# Patient Record
Sex: Female | Born: 1935 | Race: Asian | Hispanic: No | Smoking: Never smoker
Health system: Southern US, Community
[De-identification: ages and names within clinical notes are randomized; demographics above are authoritative.]

## PROBLEM LIST (undated history)

## (undated) DIAGNOSIS — M810 Age-related osteoporosis without current pathological fracture: Secondary | ICD-10-CM

## (undated) DIAGNOSIS — I10 Essential (primary) hypertension: Secondary | ICD-10-CM

## (undated) DIAGNOSIS — H9190 Unspecified hearing loss, unspecified ear: Secondary | ICD-10-CM

## (undated) DIAGNOSIS — H3530 Unspecified macular degeneration: Secondary | ICD-10-CM

## (undated) DIAGNOSIS — E039 Hypothyroidism, unspecified: Secondary | ICD-10-CM

## (undated) DIAGNOSIS — C50919 Malignant neoplasm of unspecified site of unspecified female breast: Secondary | ICD-10-CM

## (undated) DIAGNOSIS — D649 Anemia, unspecified: Secondary | ICD-10-CM

## (undated) DIAGNOSIS — R32 Unspecified urinary incontinence: Secondary | ICD-10-CM

## (undated) DIAGNOSIS — E785 Hyperlipidemia, unspecified: Secondary | ICD-10-CM

## (undated) DIAGNOSIS — I517 Cardiomegaly: Secondary | ICD-10-CM

## (undated) HISTORY — DX: Hypothyroidism, unspecified: E03.9

## (undated) HISTORY — DX: Unspecified macular degeneration: H35.30

## (undated) HISTORY — DX: Malignant neoplasm of unspecified site of unspecified female breast: C50.919

## (undated) HISTORY — DX: Hyperlipidemia, unspecified: E78.5

## (undated) HISTORY — DX: Age-related osteoporosis without current pathological fracture: M81.0

## (undated) HISTORY — DX: Cardiomegaly: I51.7

## (undated) HISTORY — DX: Anemia, unspecified: D64.9

## (undated) HISTORY — DX: Unspecified urinary incontinence: R32

## (undated) HISTORY — PX: THYROID SURGERY: SHX805

## (undated) HISTORY — DX: Essential (primary) hypertension: I10

## (undated) HISTORY — DX: Unspecified hearing loss, unspecified ear: H91.90

---

## 2009-10-25 ENCOUNTER — Encounter: Admission: RE | Admit: 2009-10-25 | Discharge: 2009-10-25 | Payer: Self-pay | Admitting: Family Medicine

## 2010-11-09 ENCOUNTER — Encounter: Payer: Self-pay | Admitting: Family Medicine

## 2011-03-17 ENCOUNTER — Other Ambulatory Visit: Payer: Self-pay | Admitting: Family Medicine

## 2011-03-17 DIAGNOSIS — R42 Dizziness and giddiness: Secondary | ICD-10-CM

## 2014-02-15 ENCOUNTER — Ambulatory Visit
Admission: RE | Admit: 2014-02-15 | Discharge: 2014-02-15 | Disposition: A | Discharge status: Home / Self Care | Payer: Medicare Other | Source: Ambulatory Visit | Attending: Family Medicine | Admitting: Family Medicine

## 2014-02-15 ENCOUNTER — Other Ambulatory Visit: Payer: Self-pay | Admitting: Family Medicine

## 2014-02-15 DIAGNOSIS — R05 Cough: Secondary | ICD-10-CM

## 2014-02-15 DIAGNOSIS — R059 Cough, unspecified: Secondary | ICD-10-CM

## 2014-03-06 ENCOUNTER — Other Ambulatory Visit (HOSPITAL_COMMUNITY): Payer: Self-pay | Admitting: Radiology

## 2014-03-06 ENCOUNTER — Other Ambulatory Visit (HOSPITAL_COMMUNITY): Payer: Medicare Other

## 2014-03-06 DIAGNOSIS — R0609 Other forms of dyspnea: Secondary | ICD-10-CM

## 2014-03-06 DIAGNOSIS — R0989 Other specified symptoms and signs involving the circulatory and respiratory systems: Principal | ICD-10-CM

## 2014-03-22 ENCOUNTER — Ambulatory Visit (HOSPITAL_COMMUNITY): Payer: Medicare Other | Attending: Cardiovascular Disease | Admitting: Radiology

## 2014-03-22 ENCOUNTER — Other Ambulatory Visit (HOSPITAL_COMMUNITY): Payer: Self-pay | Admitting: Family Medicine

## 2014-03-22 DIAGNOSIS — I079 Rheumatic tricuspid valve disease, unspecified: Secondary | ICD-10-CM | POA: Insufficient documentation

## 2014-03-22 DIAGNOSIS — I379 Nonrheumatic pulmonary valve disorder, unspecified: Secondary | ICD-10-CM | POA: Insufficient documentation

## 2014-03-22 DIAGNOSIS — R0989 Other specified symptoms and signs involving the circulatory and respiratory systems: Secondary | ICD-10-CM

## 2014-03-22 DIAGNOSIS — I2789 Other specified pulmonary heart diseases: Secondary | ICD-10-CM

## 2014-03-22 DIAGNOSIS — I272 Pulmonary hypertension, unspecified: Secondary | ICD-10-CM

## 2014-03-22 DIAGNOSIS — R0609 Other forms of dyspnea: Secondary | ICD-10-CM

## 2014-03-22 DIAGNOSIS — R0602 Shortness of breath: Secondary | ICD-10-CM

## 2014-03-22 NOTE — Progress Notes (Signed)
Echocardiogram performed.  

## 2014-03-30 ENCOUNTER — Institutional Professional Consult (permissible substitution): Payer: Medicare Other | Admitting: Internal Medicine

## 2014-08-21 ENCOUNTER — Ambulatory Visit (INDEPENDENT_AMBULATORY_CARE_PROVIDER_SITE_OTHER)
Admission: RE | Admit: 2014-08-21 | Discharge: 2014-08-21 | Disposition: A | Discharge status: Home / Self Care | Payer: Medicare Other | Source: Ambulatory Visit | Attending: Emergency Medicine | Admitting: Emergency Medicine

## 2014-08-21 ENCOUNTER — Ambulatory Visit (INDEPENDENT_AMBULATORY_CARE_PROVIDER_SITE_OTHER): Payer: Medicare Other | Admitting: Emergency Medicine

## 2014-08-21 ENCOUNTER — Encounter: Payer: Self-pay | Admitting: Emergency Medicine

## 2014-08-21 ENCOUNTER — Encounter (INDEPENDENT_AMBULATORY_CARE_PROVIDER_SITE_OTHER): Payer: Self-pay

## 2014-08-21 VITALS — BP 110/70 | HR 61 | Temp 97.0°F | Ht 60.0 in | Wt 130.0 lb

## 2014-08-21 DIAGNOSIS — R0600 Dyspnea, unspecified: Secondary | ICD-10-CM | POA: Insufficient documentation

## 2014-08-21 DIAGNOSIS — I272 Other secondary pulmonary hypertension: Secondary | ICD-10-CM

## 2014-08-21 DIAGNOSIS — IMO0002 Reserved for concepts with insufficient information to code with codable children: Secondary | ICD-10-CM | POA: Insufficient documentation

## 2014-08-21 DIAGNOSIS — I10 Essential (primary) hypertension: Secondary | ICD-10-CM | POA: Insufficient documentation

## 2014-08-21 DIAGNOSIS — I5032 Chronic diastolic (congestive) heart failure: Secondary | ICD-10-CM

## 2014-08-21 NOTE — Patient Instructions (Signed)
Chest x-ray today We will obtain your notes from Dr. Stephanie Acre Follow-up with Dr. Lamonte Sakai next available appointment

## 2014-08-21 NOTE — Progress Notes (Signed)
Subjective:    Patient ID: Carrie Cain, female    DOB: 1923/07/10, 80 y.o.   MRN: [id number]  HPI 80 yo woman, never smoker, chart notes that indicate hx of breast cancer that not been treated (4 years), HTN, cardiomegaly, hypothyroidism. She is referred by Dr Stephanie Acre today for dyspnea, recurrent episodes of dyspnea that seem to correlate with her volume status. She cannot walk long distances without stopping to rest > 127ft. Denies CP. She was treated by Dr Stephanie Acre for a PNA in late September. Has subsequently been treated for volume overload.   TTE 03/2014 > LVH with diastolic dysfxn, PASP 63SLHT.    Review of Systems  Constitutional: Negative for fever and unexpected weight change.  HENT: Negative for congestion, dental problem, ear pain, nosebleeds, postnasal drip, rhinorrhea, sinus pressure, sneezing, sore throat and trouble swallowing.   Eyes: Negative for redness and itching.  Respiratory: Positive for cough, chest tightness and shortness of breath.   Cardiovascular: Negative for palpitations and leg swelling.  Gastrointestinal: Negative for nausea and vomiting.  Genitourinary: Negative for dysuria.  Musculoskeletal: Negative for joint swelling.  Skin: Negative for rash.  Neurological: Negative for headaches.  Hematological: Does not bruise/bleed easily.  Psychiatric/Behavioral: Negative for dysphoric mood. The patient is not nervous/anxious.    Past Medical History  Diagnosis Date  . Cancer of breast   . Macular degeneration   . Cardiomegaly   . Hyperlipidemia   . Hypertension      Family History  Problem Relation Age of Onset  . Heart failure Son     deceased  . Cancer - Lung Son     deceased  . Breast cancer Daughter     deceased  . Hypertension Son   . Hyperlipidemia Son      History   Social History  . Marital Status: Unknown    Spouse Name: N/A    Number of Children: N/A  . Years of Education: N/A   Occupational History  . Not on file.    Social History Main Topics  . Smoking status: Never Smoker   . Smokeless tobacco: Not on file  . Alcohol Use: No  . Drug Use: No  . Sexual Activity: Not Currently   Other Topics Concern  . Not on file   Social History Narrative  . No narrative on file     No Known Allergies   No outpatient prescriptions prior to visit.   No facility-administered medications prior to visit.         Objective:   Physical Exam Filed Vitals:   08/21/14 1131  BP: 110/70  Pulse: 61  Temp: 97 F (36.1 C)  TempSrc: Oral  Height: 5' (1.524 m)  Weight: 130 lb (58.968 kg)  SpO2: 96%   Gen: quiet elderly woman, in no distress,  Kyphotic, her assistant states that she understands [place] but I believe that this is minimal  ENT: No lesions,  mouth clear,  oropharynx clear, no postnasal drip  Neck: No JVD, no TMG, no carotid bruits  Lungs: No use of accessory muscles, right basilar inspiratory crackles one third up, left is clear  Cardiovascular: RRR, heart sounds normal, no murmur or gallops, no peripheral edema  Musculoskeletal: No deformities, no cyanosis or clubbing  Neuro: alert, non focal  Skin: Warm, no lesions or rash  Breast exam was not performed but it was indicated to me that she has a palpable left breast mass that is tender      Assessment &  Plan:  Dyspnea Etiology unclear but based on history she has responded to diuretics. She has hypertension with diastolic dysfunction, LVH, and secondary pulmonary hypertension. Of course it is possible that other processes are contributing to her shortness of breath. She does have reported breast cancer that has been untreated and which could be causing metastatic disease. I do not hear any evidence of pleural effusion on exam but certainly this is possible as well.  - I think we should start her evaluation with a simple chest x-ray to see if she is volume overloaded she has any evidence of metastatic disease.  - Scheduled daily  low-dose Lasix may be appropriate - Follow up next available to review the chest x-ray, her notes from Dr. Cheron Schaumann and to make plans for treatment

## 2014-08-21 NOTE — Assessment & Plan Note (Signed)
Etiology unclear but based on history she has responded to diuretics. She has hypertension with diastolic dysfunction, LVH, and secondary pulmonary hypertension. Of course it is possible that other processes are contributing to her shortness of breath. She does have reported breast cancer that has been untreated and which could be causing metastatic disease. I do not hear any evidence of pleural effusion on exam but certainly this is possible as well.  - I think we should start her evaluation with a simple chest x-ray to see if she is volume overloaded she has any evidence of metastatic disease.  - Scheduled daily low-dose Lasix may be appropriate - Follow up next available to review the chest x-ray, her notes from Dr. Cheron Schaumann and to make plans for treatment

## 2014-09-03 ENCOUNTER — Other Ambulatory Visit (INDEPENDENT_AMBULATORY_CARE_PROVIDER_SITE_OTHER): Payer: Self-pay | Admitting: General Surgery

## 2014-09-03 DIAGNOSIS — C50919 Malignant neoplasm of unspecified site of unspecified female breast: Secondary | ICD-10-CM

## 2014-09-06 ENCOUNTER — Other Ambulatory Visit: Payer: Self-pay | Admitting: Radiology

## 2014-09-06 ENCOUNTER — Encounter (INDEPENDENT_AMBULATORY_CARE_PROVIDER_SITE_OTHER): Payer: Self-pay

## 2014-09-06 ENCOUNTER — Other Ambulatory Visit (INDEPENDENT_AMBULATORY_CARE_PROVIDER_SITE_OTHER): Payer: Self-pay | Admitting: General Surgery

## 2014-09-06 DIAGNOSIS — C50911 Malignant neoplasm of unspecified site of right female breast: Secondary | ICD-10-CM

## 2014-09-06 DIAGNOSIS — C50912 Malignant neoplasm of unspecified site of left female breast: Principal | ICD-10-CM

## 2014-09-07 ENCOUNTER — Other Ambulatory Visit (INDEPENDENT_AMBULATORY_CARE_PROVIDER_SITE_OTHER): Payer: Self-pay

## 2014-09-07 ENCOUNTER — Other Ambulatory Visit: Payer: Self-pay | Admitting: *Deleted

## 2014-09-07 ENCOUNTER — Encounter: Payer: Self-pay | Admitting: *Deleted

## 2014-09-07 ENCOUNTER — Telehealth (INDEPENDENT_AMBULATORY_CARE_PROVIDER_SITE_OTHER): Payer: Self-pay

## 2014-09-07 ENCOUNTER — Telehealth: Payer: Self-pay | Admitting: *Deleted

## 2014-09-07 DIAGNOSIS — C50919 Malignant neoplasm of unspecified site of unspecified female breast: Secondary | ICD-10-CM

## 2014-09-07 DIAGNOSIS — C50512 Malignant neoplasm of lower-outer quadrant of left female breast: Secondary | ICD-10-CM

## 2014-09-07 NOTE — Telephone Encounter (Signed)
Jessica from Dr. Raynaldo Opitz office at [facility] calling to report that the pt's bx results are positive.  They are faxing report over today to Dr. Darrel Hoover attention.  Dr. Marcelo Baldy is requesting Dr. Dalbert Batman call the patient.

## 2014-09-07 NOTE — Telephone Encounter (Signed)
I called Toni Amend (family friend and pt appointment coordinator) concerning new patient appt with Dr. Jana Hakim. Scheduled and confirmed appt for 09/10/14 at 3:30PM. Emailed intake form and letter to Ms Marvel Plan to fill out for pt. Pt speaks only Hindi. I have requested interpreter for this appt. Pt CNA Venise Graves will more than likely also attend appt per Ms. Marvel Plan. No further needs voiced at this time. Contact information given to Ms. Marvel Plan for further needs.

## 2014-09-07 NOTE — Telephone Encounter (Signed)
Daughter's voice mail is full. Will ask triage to continue to try to reach her today. Order for onc has already been placed on 11-19 by Dr Dalbert Batman. I did not duplicate order.

## 2014-09-07 NOTE — Telephone Encounter (Signed)
-----   Message from Fanny Skates, MD sent at 09/07/2014 11:56 AM EST ----- Please call family. Daughter in law will take message as patient does not speak english.  Tell her that Dr. Marcelo Baldy says biopsy is positive.  No surprise since was biopsied in [place] a few years ago.  ER/PR results pending.  Proceed with medical oncology consultation.  hmi

## 2014-09-07 NOTE — Progress Notes (Signed)
Received a referral from [facility] and they are requesting an urgent appt w/ Dr. Jana Hakim.  Gave paperwork to HIM for an appt.

## 2014-09-10 ENCOUNTER — Other Ambulatory Visit (HOSPITAL_BASED_OUTPATIENT_CLINIC_OR_DEPARTMENT_OTHER): Payer: Medicare Other

## 2014-09-10 ENCOUNTER — Ambulatory Visit: Payer: Medicare Other

## 2014-09-10 ENCOUNTER — Ambulatory Visit (HOSPITAL_BASED_OUTPATIENT_CLINIC_OR_DEPARTMENT_OTHER): Payer: Medicare Other | Admitting: Oncology

## 2014-09-10 ENCOUNTER — Telehealth (INDEPENDENT_AMBULATORY_CARE_PROVIDER_SITE_OTHER): Payer: Self-pay

## 2014-09-10 ENCOUNTER — Encounter: Payer: Self-pay | Admitting: *Deleted

## 2014-09-10 VITALS — BP 153/84 | HR 58 | Temp 97.9°F | Resp 18 | Ht 60.0 in | Wt 128.6 lb

## 2014-09-10 DIAGNOSIS — C50512 Malignant neoplasm of lower-outer quadrant of left female breast: Secondary | ICD-10-CM

## 2014-09-10 DIAGNOSIS — C50911 Malignant neoplasm of unspecified site of right female breast: Secondary | ICD-10-CM

## 2014-09-10 LAB — COMPREHENSIVE METABOLIC PANEL (CC13)
ALT: 6 U/L (ref 0–55)
AST: 15 U/L (ref 5–34)
Albumin: 3.1 g/dL — ABNORMAL LOW (ref 3.5–5.0)
Alkaline Phosphatase: 107 U/L (ref 40–150)
Anion Gap: 10 mEq/L (ref 3–11)
BILIRUBIN TOTAL: 0.83 mg/dL (ref 0.20–1.20)
BUN: 18.6 mg/dL (ref 7.0–26.0)
CALCIUM: 8.8 mg/dL (ref 8.4–10.4)
CHLORIDE: 104 meq/L (ref 98–109)
CO2: 26 mEq/L (ref 22–29)
CREATININE: 0.8 mg/dL (ref 0.6–1.1)
Glucose: 108 mg/dl (ref 70–140)
Potassium: 3.7 mEq/L (ref 3.5–5.1)
SODIUM: 140 meq/L (ref 136–145)
Total Protein: 6.7 g/dL (ref 6.4–8.3)

## 2014-09-10 LAB — CBC WITH DIFFERENTIAL/PLATELET
BASO%: 0.6 % (ref 0.0–2.0)
Basophils Absolute: 0 10*3/uL (ref 0.0–0.1)
EOS%: 3.5 % (ref 0.0–7.0)
Eosinophils Absolute: 0.2 10*3/uL (ref 0.0–0.5)
HEMATOCRIT: 36.3 % (ref 34.8–46.6)
HGB: 11.6 g/dL (ref 11.6–15.9)
LYMPH#: 2.1 10*3/uL (ref 0.9–3.3)
LYMPH%: 29.8 % (ref 14.0–49.7)
MCH: 19.9 pg — AB (ref 25.1–34.0)
MCHC: 32 g/dL (ref 31.5–36.0)
MCV: 62.3 fL — ABNORMAL LOW (ref 79.5–101.0)
MONO#: 0.6 10*3/uL (ref 0.1–0.9)
MONO%: 8.9 % (ref 0.0–14.0)
NEUT#: 3.9 10*3/uL (ref 1.5–6.5)
NEUT%: 57.2 % (ref 38.4–76.8)
Platelets: 338 10*3/uL (ref 145–400)
RBC: 5.83 10*6/uL — AB (ref 3.70–5.45)
RDW: 16.2 % — ABNORMAL HIGH (ref 11.2–14.5)
WBC: 6.9 10*3/uL (ref 3.9–10.3)

## 2014-09-10 MED ORDER — ANASTROZOLE 1 MG PO TABS: 1.0000 mg | ORAL_TABLET | Freq: Every day | ORAL | Status: DC

## 2014-09-10 MED ORDER — OMEPRAZOLE 20 MG PO CPDR: 20.0000 mg | DELAYED_RELEASE_CAPSULE | Freq: Every day | ORAL | Status: AC

## 2014-09-10 NOTE — Progress Notes (Signed)
[place]  [phone] [phone]     ID: Carrie Cain DOB: [birth year]  MR#: [id number]  JYN#:[id number]  Patient Care Team: Lilian Coma, MD as PCP - General (Family Medicine) Chauncey Cruel, MD as Consulting Physician (Oncology) PCP: Lilian Coma, MD GYN: SU: Rolm Bookbinder; Fanny Skates OTHER MD: Baltazar Apo, Jacelyn Pi  CHIEF COMPLAINT: Locally advanced breast cancer  CURRENT TREATMENT: Anastrozole   BREAST CANCER HISTORY: The patient was in [place] living with family members when she was found to have a lump in her left breast. We do not have any data regarding the workup and we do not know whether the patient had a biopsy at that time. However the patient was told she had breast cancer. She refused all further evaluation and all treatment.  Since that time the patient has moved to this area to be with her son Carrie Cain. Recently she complained of pain in the left breast. The family found that she had a fungating mass arising from the lower outer quadrant of her left breast. She was set up for mammography and ultrasonography at [facility], performed 09/05/2014. The breast composition was category 8. Mammography showed at least 2 separate masses in each breast, and bilateral axillary adenopathy. By ultrasonography there were 3 separate areas in the right breast, the largest measuring 1.7 cm, and 4 separate areas in the left breast, the largest measuring 4.6 cm. Again, bilateral axillary adenopathy was noted.  On 09/06/2014 biopsy of the largest left breast mass ([id number]) showed invasive ductal carcinoma, low-grade, with a prognostic panel pending.  The patient was scheduled to see Dr. Donne Hazel, but as he was not available she saw Dr. Dalbert Batman. He referred her here for further evaluation  INTERVAL HISTORY: The patient was evaluated in the breast clinic 09/10/2014 accompanied by her daughter-in-law Carrie Cain, by the patient's  caregiver and by a hint the translator  REVIEW OF SYSTEMS: When asked directly the patient denies all symptoms except for discomfort in the left breast. Carrie Cain, the patient's daughter-in-law, tells me the patient in fact has complained of headaches about twice a week, sometimes has nausea, and has had significant problems with shortness of breath, including a bout of pneumonia in September 2015.Marland Kitchen She was evaluated by Dr. Kyung Rudd regarding this. Other problems include weight loss, insomnia, fatigue, hearing loss, hoarseness, dry cough, mouth sores, palpitations, chest pain, urinary stress in continence, history of skin cancers, back and joint pain, difficulty walking, depression and a possible thyroid problem.   PAST MEDICAL HISTORY: Past Medical History  Diagnosis Date  . Cancer of breast   . Macular degeneration   . Cardiomegaly   . Hyperlipidemia   . Hypertension     PAST SURGICAL HISTORY: No past surgical history on file.  FAMILY HISTORY Family History  Problem Relation Age of Onset  . Heart failure Son     deceased  . Cancer - Lung Son     deceased  . Breast cancer Daughter     deceased  . Hypertension Son   . Hyperlipidemia Son    the patient does not know the age of death or cause of death of her father. Her mother died at approximately age 73. The patient has 3 brothers and 3 sisters. 2 of the patient's brothers had cancer of the mouth. One of the patient's daughters was diagnosed with breast cancer at age 26. There is no other history of breast or ovarian cancer in the family.  GYNECOLOGIC HISTORY:  No  LMP recorded. Patient is postmenopausal. Menarche between 42 and 6. First live birth age 55. The  patient is GX P6. Menopause came in her mid 28s.  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 She is widowed, and lives with her son Carrie Cain who is Teacher, English as a foreign language of a Psychiatric nurse in [place]. Other children include Carrie Cain"), 40 years old, lives in [place];  Carrie Cain, who works as a Insurance underwriter; Carrie Cain who died in war at the age of 70; Carrie Cain, who lives in [place] and is currently unemployed; and Warden/ranger, worked as an Arboriculturist, but died of lung cancer at the age of 46.    ADVANCED DIRECTIVES: In place. Specifically the patient has a standing DO NOT RESUSCITATE order. Her son Carrie Cain is her healthcare power of attorney.   HEALTH MAINTENANCE: History  Substance Use Topics  . Smoking status: Never Smoker   . Smokeless tobacco: Not on file  . Alcohol Use: No     Colonoscopy:  PAP:  Bone density:  Lipid panel:  Allergies  Allergen Reactions  . Sulfa Antibiotics Rash    Current Outpatient Prescriptions  Medication Sig Dispense Refill  . Acetaminophen-Codeine (TYLENOL WITH CODEINE #3 PO) Take 1 tablet by mouth 4 (four) times daily as needed.    Marland Kitchen atenolol (TENORMIN) 50 MG tablet Take 75 mg by mouth. 1 1/2 tablets daily  5  . diclofenac sodium (VOLTAREN) 1 % GEL Apply topically 4 (four) times daily.    . furosemide (LASIX) 20 MG tablet Take 20 mg by mouth daily.  0  . levothyroxine (SYNTHROID, LEVOTHROID) 125 MCG tablet Take 125 mcg by mouth daily before breakfast.    . meclizine (ANTIVERT) 25 MG tablet Take 25 mg by mouth 3 (three) times daily as needed for dizziness.    Marland Kitchen UNABLE TO FIND avastin- ocular injection weekly x 5    . anastrozole (ARIMIDEX) 1 MG tablet Take 1 tablet (1 mg total) by mouth daily. 90 tablet 4  . omeprazole (PRILOSEC) 20 MG capsule Take 1 capsule (20 mg total) by mouth at bedtime. 30 capsule 12   No current facility-administered medications for this visit.    OBJECTIVE: Elderly salvation woman in no acute distress Filed Vitals:   09/10/14 1616  BP: 153/84  Pulse: 58  Temp: 97.9 F (36.6 C)  Resp: 18     Body mass index is 25.12 kg/(m^2).    ECOG FS:2 - Symptomatic, <50% confined to bed  Ocular: Sclerae unicteric, EOMs intact Ear-nose-throat: Oropharynx clear and moist Lymphatic: No cervical or  supraclavicular adenopathy Lungs no rales or rhonchi, no wheezes Heart regular rate and rhythm Abd soft, nontender, positive bowel sounds MSK kyphosis but only minimal spinal tenderness over the lumbar spine Neuro: non-focal, pleasant affect Breasts: There are no skin or nipple changes of concern in the right breast. There are however at least 2 palpable masses, the one easiest to palpate is in the upper outer quadrant laterally and measures perhaps 2 cm. The right axilla is benign by palpation. In the left breast there is a fungating mass measuring approximately 3 cm, erythematous, but not ulcerated. This was photographed today. I do not palpate a well-defined mass in the left axilla.   LAB RESULTS:  CMP     Component Value Date/Time   NA 140 09/10/2014 1601   K 3.7 09/10/2014 1601   CO2 26 09/10/2014 1601   GLUCOSE 108 09/10/2014 1601   BUN 18.6 09/10/2014 1601   CREATININE 0.8 09/10/2014  1601   CALCIUM 8.8 09/10/2014 1601   PROT 6.7 09/10/2014 1601   ALBUMIN 3.1* 09/10/2014 1601   AST 15 09/10/2014 1601   ALT 6 09/10/2014 1601   ALKPHOS 107 09/10/2014 1601   BILITOT 0.83 09/10/2014 1601    INo results found for: SPEP, UPEP  Lab Results  Component Value Date   WBC 6.9 09/10/2014   NEUTROABS 3.9 09/10/2014   HGB 11.6 09/10/2014   HCT 36.3 09/10/2014   MCV 62.3* 09/10/2014   PLT 338 09/10/2014      Chemistry      Component Value Date/Time   NA 140 09/10/2014 1601   K 3.7 09/10/2014 1601   CO2 26 09/10/2014 1601   BUN 18.6 09/10/2014 1601   CREATININE 0.8 09/10/2014 1601      Component Value Date/Time   CALCIUM 8.8 09/10/2014 1601   ALKPHOS 107 09/10/2014 1601   AST 15 09/10/2014 1601   ALT 6 09/10/2014 1601   BILITOT 0.83 09/10/2014 1601       No results found for: LABCA2  No components found for: LABCA125  No results for input(s): INR in the last 168 hours.  Urinalysis No results found for: COLORURINE, APPEARANCEUR, LABSPEC, PHURINE, GLUCOSEU,  HGBUR, BILIRUBINUR, KETONESUR, PROTEINUR, UROBILINOGEN, NITRITE, LEUKOCYTESUR  STUDIES: Dg Chest 2 View  08/21/2014   CLINICAL DATA:  80 year old female with shortness of breath, cough and congestion. History of breast cancer.  EXAM: CHEST  2 VIEW  COMPARISON:  02/15/2014 chest radiograph  FINDINGS: Cardiomegaly is again identified.  Peribronchial thickening and slightly increased bibasilar opacities noted.  There is no evidence of pneumothorax or pleural effusion.  No acute bony abnormalities are present. Remote T6 and T12 compression fractures are again noted.  Large hiatal hernia is again noted.  IMPRESSION: Slightly increasing bibasilar opacities probably representing atelectasis but pneumonia is not entirely excluded.  Peribronchial thickening which is likely chronic.  Large hiatal hernia and remote thoracic spine compression fractures again noted.   Electronically Signed   By: Hassan Rowan M.D.   On: 08/21/2014 15:04    ASSESSMENT: 80 y.o. [place] woman with bilateral breast cancer diagnosed with left breast cancer in [place] in 2009 (not clear if biopsy obtained) now with mammography and ultrasonography 09/05/2014 showing  (a) on the Right, clinically multifocal T1c N1, stage IIA breast disease  (b) on the Left, clinically multifocal T2 N1, stage IIB breast disease (T4 N1 or stage IIIB by exam)  (1) biopsy of the 4.6 cm left lower outer quadrant breast mass 09/06/2014 confirmed invasive ductal carcinoma, low-grade, prognostic panel pending  PLAN: We spent the better part of today's hour-long appointment discussing the biology of breast cancer in general, and the specifics of the patient's tumor in particular. Normally we would proceed to full staging with a PET scan and chest CT scan for stage III disease. However in this case, dealing with a very elderly patient who has refused treatment in the past the goal of treatment is purely palliative. In fact the patient only agreed to further  evaluation because she was having pain in the left breast. There is a certain degree of dementia, which is difficult for me to evaluate because of the language barrier. The patient already has a living will and health care power of attorney with a DO NOT RESUSCITATE order in place.  My hope is that we will be able to control this problem so that the patient can maintain her usual functional status and quality of life.  If the patient is estrogen receptor positive, we will start anastrozole. We discussed the possible toxicities, side effects and complications of this agent and I gave the patient and her caregivers a copy of our handout summarizing those possible problems and also differentiating them from, and postmenopausal issues.  If the patient's tumor proves to be estrogen receptor negative, which is unlikely, we will have to proceed to palliative surgery. Possibly this could be done under local anesthesia or perhaps under a temporary block. If that were not feasible we would consider palliative radiation.  After going over all this, I suggested that we start naproxen at 220 mg 3 times a day with food, with omeprazole 20 mg at bedtime. Hopefully this will control the pain sufficiently that the patient can function normally. I also placed a prescription for anastrozole, to be started once we have confirmation of the estrogen receptor status.  The patient will see me again in 2 months. If there has been evidence of response, we will continue anastrozole indefinitely. Otherwise we will proceed as outlined above.  I believe the patient has sufficient understanding that she can and agreed to this plan. Her caregivers generally does. The goal of treatment is control. They know to call me for any problems that may develop before the next visit here.  The patient has a good understanding of the overall plan. She agrees with it. She knows the goal of treatment in her case is cure. She will call with any  problems that may develop before her next visit here.  Chauncey Cruel, MD   09/10/2014 6:38 PM

## 2014-09-10 NOTE — Telephone Encounter (Signed)
-----   Message from [facility], [place] sent at 09/07/2014  3:53 PM EST ----- Regarding: GM appt Hi Dr. Dalbert Batman,  Ms Joyce [facility] will see Dr. Jana Hakim on 09/10/14 at 3:30  Thanks, [facility]

## 2014-09-10 NOTE — Progress Notes (Signed)
Completed chart, labs entered, added to spreadsheet and placed in Dr. Virgie Dad box and made him aware.

## 2014-09-10 NOTE — Telephone Encounter (Signed)
Appt below noted.

## 2014-09-11 ENCOUNTER — Telehealth: Payer: Self-pay | Admitting: Emergency Medicine

## 2014-09-11 ENCOUNTER — Encounter: Payer: Self-pay | Admitting: *Deleted

## 2014-09-11 ENCOUNTER — Telehealth: Payer: Self-pay | Admitting: Oncology

## 2014-09-11 NOTE — Progress Notes (Signed)
Printed picture for Dr. Jana Hakim and took to [facility] in [facility] to scan.

## 2014-09-11 NOTE — Telephone Encounter (Signed)
Spoke with Santiago Glad in regards to pathology results; Instructed her; per Dr Jana Hakim; to have the Anastrozole filled and have Ms Neuhaus begin taking it as directed. Informed Renee that per Dr Jana Hakim the tumor is "strongly estrogen receptor positive". Santiago Glad verbalized understanding. Advised her to call with any further questions or concerns.

## 2014-09-11 NOTE — Telephone Encounter (Signed)
per pof to sch pt appt-cld & spoke to pt w/appt time & date

## 2014-09-17 ENCOUNTER — Other Ambulatory Visit: Payer: Medicare Other

## 2014-11-05 ENCOUNTER — Ambulatory Visit: Payer: Medicare Other | Admitting: Oncology

## 2015-06-21 ENCOUNTER — Ambulatory Visit
Admission: RE | Admit: 2015-06-21 | Discharge: 2015-06-21 | Disposition: A | Discharge status: Home / Self Care | Payer: Medicare Other | Source: Ambulatory Visit | Attending: Family Medicine | Admitting: Family Medicine

## 2015-06-21 ENCOUNTER — Other Ambulatory Visit: Payer: Self-pay | Admitting: Family Medicine

## 2015-06-21 DIAGNOSIS — M25511 Pain in right shoulder: Secondary | ICD-10-CM

## 2015-06-26 NOTE — Patient Outreach (Signed)
[facility]) Care Management  06/26/2015  Carrie Cain [birth year] [id number]   Referral from MD, assigned to Carrie Cain, [facility] for patient outreach.  Carrie Cain, [place] Care Management Assistant

## 2015-06-28 ENCOUNTER — Other Ambulatory Visit: Payer: Self-pay

## 2015-06-28 DIAGNOSIS — I10 Essential (primary) hypertension: Secondary | ICD-10-CM

## 2015-06-28 NOTE — Patient Outreach (Signed)
Screening call: Sheylin Scharnhorst returned call and states that she is health care power.  Reports that patient has dementia and does not speak [place]. Reports that patient lives with her.  Reports that patient has a nurse daily to assist with getting the day started and the nurse comes back in the evening. Daughter in law reports that she needs more assistance with patient during the day and on the weekend. Daughter in law request a Education officer, museum assistance. Daughter in law would like social worker visit and assistance. Denies any nursing concerns at this time.     PLAN:  No nursing needs. Order placed for [facility] social worker.  Tomasa Rand, RN, BSN, CEN [facility] [employer] [phone]

## 2015-06-28 NOTE — Patient Outreach (Signed)
MD referral:  Reviewed [facility] referral. Appears referral is needed for level of care issues and assistance needed at home. Lives in [place].  Placed call to contact numbers listed. Left a message requesting call back.  Tomasa Rand, RN, BSN, CEN [facility] [employer] [phone]

## 2015-07-01 NOTE — Patient Outreach (Signed)
[facility]) Care Management  07/01/2015  Amaliya Whitelaw [birth year] [id number]   Request from Tomasa Rand, RN to assign SW, assigned [employer], [place].  Thanks, Ronnell Freshwater. [place], [place] Assistant Phone: [phone] Fax: [phone]

## 2015-07-03 ENCOUNTER — Other Ambulatory Visit: Payer: Self-pay | Admitting: *Deleted

## 2015-07-03 NOTE — Patient Outreach (Addendum)
Carrie Cain) Care Management  07/03/2015  Carrie Cain 1923-09-20 [id number]   Phone call to patient's daughter in law to assist with questions regarding need for more assistance in the home.  Per patient's daughter in law, patient lives with her and her 79 year old son.  Patient's son  died in Oct 09, 2023 and since that time daughter in law has been the primary caretaker for patient.  She explained that she does privately pay a CNA to assist patient 8 hours a day, 5 days a week, however has no assistance for patient on the weekends.  Patient does not meet eligibility requirements for Medicaid and there is no family histroy of military involvement.  Patient has other children, however they do not live locally.  Day programs discussed as well as respite care.  However per patient's daughter in law, patient is not that mobile and speaks little english.  Home visit scheduled for 07/08/15 at 9am.  Patient's options to be discussed further in regards to day programs and respite care.    Carrie Cain [facility] Care Management [phone]

## 2015-07-08 ENCOUNTER — Other Ambulatory Visit: Payer: Self-pay | Admitting: *Deleted

## 2015-07-08 NOTE — Patient Outreach (Addendum)
[facility]) Care Management  [facility] Social Work  07/08/2015  Zakyria Metzinger 12-Jan-1923 [id number]  Subjective: " I have a personal care aid for my mother in law during the week, however on the weekends when I may have a business trip or an activity with my son it get's difficult"  Objective:   Current Medications:  Current Outpatient Prescriptions  Medication Sig Dispense Refill  . Acetaminophen-Codeine (TYLENOL WITH CODEINE #3 PO) Take 1 tablet by mouth 4 (four) times daily as needed.    Marland Kitchen anastrozole (ARIMIDEX) 1 MG tablet Take 1 tablet (1 mg total) by mouth daily. 90 tablet 4  . atenolol (TENORMIN) 50 MG tablet Take 75 mg by mouth. 1 1/2 tablets daily  5  . diclofenac sodium (VOLTAREN) 1 % GEL Apply topically 4 (four) times daily.    . furosemide (LASIX) 20 MG tablet Take 20 mg by mouth daily.  0  . levothyroxine (SYNTHROID, LEVOTHROID) 125 MCG tablet Take 125 mcg by mouth daily before breakfast.    . meclizine (ANTIVERT) 25 MG tablet Take 25 mg by mouth 3 (three) times daily as needed for dizziness.    . Multiple Vitamins-Minerals ([place] 50+) CAPS Take by mouth.    . Multiple Vitamins-Minerals (ONE-A-DAY WOMENS 50 PLUS PO) Take by mouth once.    Marland Kitchen omeprazole (PRILOSEC) 20 MG capsule Take 1 capsule (20 mg total) by mouth at bedtime. 30 capsule 12  . polyvinyl alcohol (LIQUIFILM TEARS) 1.4 % ophthalmic solution 1 drop as needed for dry eyes (prn post avastin pain).    Marland Kitchen UNABLE TO FIND avastin- ocular injection weekly x 5     No current facility-administered medications for this visit.    Functional Status:  In your present state of health, do you have any difficulty performing the following activities: 07/08/2015  Hearing? N  Vision? N  Difficulty concentrating or making decisions? N  Walking or climbing stairs? N  Dressing or bathing? N  Doing errands, shopping? N  Preparing Food and eating ? Y  Using the Toilet? Y  In the past six months, have  you accidently leaked urine? Y  Do you have problems with loss of bowel control? Y  Managing your Medications? Y  Managing your Finances? Y  Housekeeping or managing your Housekeeping? Y    Fall/Depression Screening:  No flowsheet data found.   Depression screen PHQ 2/9 07/08/2015  Decreased Interest 1  Down, Depressed, Hopeless 0  PHQ - 2 Score 1  Da  Fall Risk  07/08/2015  Falls in the past year? Yes  Number falls in past yr: 1  Injury with Fall? No  Risk for fall due to : History of fall(s);Impaired balance/gait;Impaired mobility;Impaired vision;Mental status change  Follow up Falls prevention discussed    Assessment: Education officer, museum met with patient's daughter in law, Santiago Glad [facility] [place] shadowed visit)  [facility] case management services discussed.  Santiago Glad discussed the sudden loss of her husband in December and the adjustments that needed to be made.  She discussed having a positive support system of family and friends, participating in grief counseling in the past and now being patient's main caregiver at this point.  Santiago Glad currently works from home and has hired a Quarry manager for patient during the week, 3 hours in the morning and 4 hours in the afternoon.  However, she has no CNA on the weekends.  This Education officer, museum explored patient's options not only for personal care assistance but to increase her socialization as  per Santiago Glad, patient mainly remains in her room.  The PACE program(all inclusive care) discussed as well as Day Care and Respite Services provided by the [facility].  A list was also provided to patient's daughter in law for nursing home facilities that provided respite care over night.  It was explained that due to patient's income, the above named services may be an out of pocket expense.  This Education officer, museum  met with patient in her room.  Patient speaks limited English, her daughter was contacted by Skype and was able to translate.  [facility] case management services  discussed, consent signed.  Patient remained in bed during the visit and was very welcoming and friendly.  Plan: After considering patient's options, it was concluded that it would be best at this time to extend the personal care hours to cover the weekends. This Education officer, museum will provide a list to Santiago Glad of additional personal care agencies to extend care on the weekends.  Note to be routed to patient's primary care physician.    [facility] CM Care Plan Problem One        Most Recent Value   Care Plan Problem One  personal care services   Role Documenting the Problem One  Clinical Social Worker   Care Plan for Problem One  Active   [facility] CM Short Term Goal #1 (0-30 days)  daughter in law to verbalize receipt of personal care agencies to extend  patient 's care to cover the weekends within the next 30 days   [facility] CM Short Term Goal #1 Start Date  07/08/15   Interventions for Short Term Goal #1  CSW discusssed plan to put together list of agencies that cover her area and will forward them to her     [place] [place] Management [phone]

## 2015-07-16 ENCOUNTER — Other Ambulatory Visit: Payer: Self-pay | Admitting: *Deleted

## 2015-07-16 NOTE — Patient Outreach (Signed)
[facility]) Care Management  07/16/2015  Natalynn Pedone June 14, 1923 [id number]   Phone call to patient's daughter in law Johniece Hornbaker to provide her with in home care agencies to consider to extend patient's in home support.  Voicemail message left for a return call.   [name] [facility] Care Management [phone]

## 2015-07-23 ENCOUNTER — Other Ambulatory Visit: Payer: Self-pay | Admitting: *Deleted

## 2015-07-23 NOTE — Patient Outreach (Signed)
[facility]) Care Management  07/23/2015  Aylyn Wenzler Oct 10, 1923 [id number]   Phone call to patient's daughter in law to discuss private duty CNA list.  List will be put in the mail for her review.   [name] [facility] Care Management [phone]

## 2015-08-12 ENCOUNTER — Other Ambulatory Visit: Payer: Self-pay | Admitting: *Deleted

## 2015-08-12 NOTE — Patient Outreach (Signed)
[place] [facility]) Care Management  08/12/2015  Carrie Cain 1923/07/17 [id number]   Phone call to patient's daughter in law to follow up on list sent of private duty aids.  Per patient's daughter in law she will have to check the mail and get back with me.   This Education officer, museum will call patient back in 1 week.    [name] [facility] Care Management [phone]

## 2015-09-02 ENCOUNTER — Other Ambulatory Visit: Payer: Self-pay | Admitting: *Deleted

## 2015-09-02 NOTE — Patient Outreach (Signed)
[place] [facility]) Care Management  09/02/2015  Carrie Cain [birth year] [id number]   Phone call to patient's daughter in law to assess for continued social work involvement.  Per patient's daughter in law, list of in home aids received.  No further social needs at this time.  Patient's case to be closed.   [name] [facility] Care Management [phone]

## 2015-10-07 ENCOUNTER — Other Ambulatory Visit: Payer: Self-pay | Admitting: Oncology

## 2015-12-17 ENCOUNTER — Telehealth: Payer: Self-pay | Admitting: Oncology

## 2015-12-17 NOTE — Telephone Encounter (Signed)
Returned call to patient care taker Dorena Dew [phone]) re request for an appointment. Patient missed last scheduled f/u January 2016. Spoke with Ms. Berenice Primas and per Ms. Graves patient does not have anything new going on and appointment is not urgent - just want to get patient back on schedule. Gave Ms. Graves appointment for f/u with GM 01/28/16 @ 4 pm.

## 2015-12-19 ENCOUNTER — Other Ambulatory Visit: Payer: Self-pay | Admitting: Oncology

## 2015-12-20 ENCOUNTER — Other Ambulatory Visit: Payer: Self-pay | Admitting: *Deleted

## 2015-12-20 ENCOUNTER — Telehealth: Payer: Self-pay | Admitting: *Deleted

## 2015-12-20 DIAGNOSIS — C50512 Malignant neoplasm of lower-outer quadrant of left female breast: Secondary | ICD-10-CM

## 2015-12-20 MED ORDER — ANASTROZOLE 1 MG PO TABS: 1.0000 mg | ORAL_TABLET | Freq: Every day | ORAL | Status: DC

## 2015-12-20 NOTE — Telephone Encounter (Signed)
Called pt 's relative to let her know the Arimidex has been filled and pt can pick up today and also reminded her of pt's appt on 02/05/16/ with Dr. Jana Hakim.

## 2016-01-13 ENCOUNTER — Emergency Department (HOSPITAL_COMMUNITY): Payer: Medicare Other

## 2016-01-13 ENCOUNTER — Emergency Department (HOSPITAL_COMMUNITY)
Admission: EM | Admit: 2016-01-13 | Discharge: 2016-01-13 | Disposition: A | Discharge status: Home / Self Care | Payer: Medicare Other | Attending: Emergency Medicine | Admitting: Emergency Medicine

## 2016-01-13 ENCOUNTER — Encounter (HOSPITAL_COMMUNITY): Payer: Self-pay

## 2016-01-13 DIAGNOSIS — I10 Essential (primary) hypertension: Secondary | ICD-10-CM | POA: Insufficient documentation

## 2016-01-13 DIAGNOSIS — Z791 Long term (current) use of non-steroidal anti-inflammatories (NSAID): Secondary | ICD-10-CM | POA: Insufficient documentation

## 2016-01-13 DIAGNOSIS — Z8639 Personal history of other endocrine, nutritional and metabolic disease: Secondary | ICD-10-CM | POA: Insufficient documentation

## 2016-01-13 DIAGNOSIS — Y9289 Other specified places as the place of occurrence of the external cause: Secondary | ICD-10-CM | POA: Insufficient documentation

## 2016-01-13 DIAGNOSIS — Y9389 Activity, other specified: Secondary | ICD-10-CM | POA: Diagnosis not present

## 2016-01-13 DIAGNOSIS — W19XXXA Unspecified fall, initial encounter: Secondary | ICD-10-CM | POA: Insufficient documentation

## 2016-01-13 DIAGNOSIS — Z8739 Personal history of other diseases of the musculoskeletal system and connective tissue: Secondary | ICD-10-CM | POA: Diagnosis not present

## 2016-01-13 DIAGNOSIS — Y999 Unspecified external cause status: Secondary | ICD-10-CM | POA: Insufficient documentation

## 2016-01-13 DIAGNOSIS — F0390 Unspecified dementia without behavioral disturbance: Secondary | ICD-10-CM | POA: Insufficient documentation

## 2016-01-13 DIAGNOSIS — S20419A Abrasion of unspecified back wall of thorax, initial encounter: Secondary | ICD-10-CM | POA: Insufficient documentation

## 2016-01-13 DIAGNOSIS — S39012A Strain of muscle, fascia and tendon of lower back, initial encounter: Secondary | ICD-10-CM | POA: Diagnosis not present

## 2016-01-13 DIAGNOSIS — Z79899 Other long term (current) drug therapy: Secondary | ICD-10-CM | POA: Insufficient documentation

## 2016-01-13 DIAGNOSIS — S3992XA Unspecified injury of lower back, initial encounter: Secondary | ICD-10-CM | POA: Diagnosis present

## 2016-01-13 DIAGNOSIS — Z853 Personal history of malignant neoplasm of breast: Secondary | ICD-10-CM | POA: Diagnosis not present

## 2016-01-13 LAB — BASIC METABOLIC PANEL
ANION GAP: 12 (ref 5–15)
BUN: 19 mg/dL (ref 6–20)
CALCIUM: 8.9 mg/dL (ref 8.9–10.3)
CO2: 27 mmol/L (ref 22–32)
Chloride: 101 mmol/L (ref 101–111)
Creatinine, Ser: 0.96 mg/dL (ref 0.44–1.00)
GFR calc Af Amer: 59 mL/min — ABNORMAL LOW (ref >60)
GFR calc non Af Amer: 51 mL/min — ABNORMAL LOW (ref >60)
GLUCOSE: 101 mg/dL — AB (ref 65–99)
Potassium: 3.5 mmol/L (ref 3.5–5.1)
SODIUM: 140 mmol/L (ref 135–145)

## 2016-01-13 LAB — CBC WITH DIFFERENTIAL/PLATELET
Basophils Absolute: 0.1 10*3/uL (ref 0.0–0.1)
Basophils Relative: 1 %
EOS ABS: 0.3 10*3/uL (ref 0.0–0.7)
EOS PCT: 3 %
HCT: 36.7 % (ref 36.0–46.0)
Hemoglobin: 11.9 g/dL — ABNORMAL LOW (ref 12.0–15.0)
LYMPHS ABS: 2 10*3/uL (ref 0.7–4.0)
Lymphocytes Relative: 23 %
MCH: 20.8 pg — ABNORMAL LOW (ref 26.0–34.0)
MCHC: 32.4 g/dL (ref 30.0–36.0)
MCV: 64.2 fL — AB (ref 78.0–100.0)
MONO ABS: 0.7 10*3/uL (ref 0.1–1.0)
Monocytes Relative: 8 %
NEUTROS PCT: 65 %
Neutro Abs: 5.7 10*3/uL (ref 1.7–7.7)
Platelets: 270 10*3/uL (ref 150–400)
RBC: 5.72 MIL/uL — AB (ref 3.87–5.11)
RDW: 15.8 % — AB (ref 11.5–15.5)
WBC: 8.8 10*3/uL (ref 4.0–10.5)

## 2016-01-13 MED ORDER — DOCUSATE SODIUM 100 MG PO CAPS: 100.0000 mg | ORAL_CAPSULE | Freq: Two times a day (BID) | ORAL | Status: DC

## 2016-01-13 MED ORDER — ACETAMINOPHEN-CODEINE #3 300-30 MG PO TABS: 1.0000 | ORAL_TABLET | Freq: Four times a day (QID) | ORAL | Status: DC | PRN

## 2016-01-13 MED ORDER — ACETAMINOPHEN-CODEINE #3 300-30 MG PO TABS
2.0000 | ORAL_TABLET | Freq: Once | ORAL | Status: AC
  Administered 2016-01-13: 2 via ORAL
  Filled 2016-01-13: qty 2

## 2016-01-13 NOTE — ED Provider Notes (Signed)
CSN: [id number]     Arrival date & time 01/13/16  1005 History   First MD Initiated Contact with Patient 01/13/16 1042     Chief Complaint  Patient presents with  . Fall  . Cancer     (Consider location/radiation/quality/duration/timing/severity/associated sxs/prior Treatment) HPI The patient's in-home caregiver went this morning to try to get the patient up and changed and the patient complained of a lot of pain in her lower back and did not want to change her clothes. She has not been sick. She has been eating as per usual. She has not had fever. She does have some dementia. History of breast cancer. She is at normal mental status baseline. There is report that she may have had a fall over the weekend. The patient is not a good historian although she is awake and alert and answering some questions. Past Medical History  Diagnosis Date  . Cancer of breast ([place])   . Macular degeneration   . Cardiomegaly   . Hyperlipidemia   . Hypertension    History reviewed. No pertinent past surgical history. Family History  Problem Relation Age of Onset  . Heart failure Son     deceased  . Cancer - Lung Son     deceased  . Breast cancer Daughter     deceased  . Hypertension Son   . Hyperlipidemia Son    Social History  Substance Use Topics  . Smoking status: Never Smoker   . Smokeless tobacco: None  . Alcohol Use: No   OB History    No data available     Review of Systems    Allergies  Sulfa antibiotics  Home Medications   Prior to Admission medications   Medication Sig Start Date End Date Taking? Authorizing Provider  anastrozole (ARIMIDEX) 1 MG tablet Take 1 tablet (1 mg total) by mouth daily. 12/20/15  Yes Chauncey Cruel, MD  atenolol (TENORMIN) 50 MG tablet Take 75 mg by mouth daily. 1 1/2 tablets daily 07/26/14  Yes Historical Provider, MD  furosemide (LASIX) 20 MG tablet Take 20 mg by mouth daily. 07/26/14  Yes Historical Provider, MD  levothyroxine (SYNTHROID,  LEVOTHROID) 125 MCG tablet Take 125 mcg by mouth daily before breakfast.   Yes Historical Provider, MD  meclizine (ANTIVERT) 25 MG tablet Take 25 mg by mouth 3 (three) times daily as needed for dizziness.   Yes Historical Provider, MD  Multiple Vitamins-Minerals ([place] 50+) CAPS Take by mouth.   Yes Historical Provider, MD  Multiple Vitamins-Minerals (ONE-A-DAY WOMENS 50 PLUS PO) Take by mouth once.   Yes Historical Provider, MD  omeprazole (PRILOSEC) 20 MG capsule Take 1 capsule (20 mg total) by mouth at bedtime. 09/10/14  Yes Chauncey Cruel, MD  polyvinyl alcohol (LIQUIFILM TEARS) 1.4 % ophthalmic solution 1 drop as needed for dry eyes (prn post avastin pain).   Yes Historical Provider, MD  UNABLE TO FIND avastin- ocular injection weekly x 5   Yes Historical Provider, MD  acetaminophen-codeine (TYLENOL #3) 300-30 MG tablet Take 1-2 tablets by mouth every 6 (six) hours as needed for moderate pain. 01/13/16   Charlesetta Shanks, MD  Acetaminophen-Codeine (TYLENOL WITH CODEINE #3 PO) Take 1 tablet by mouth 4 (four) times daily as needed.    Historical Provider, MD  diclofenac sodium (VOLTAREN) 1 % GEL Apply topically 4 (four) times daily.    Historical Provider, MD  docusate sodium (COLACE) 100 MG capsule Take 1 capsule (100 mg total) by mouth every  12 (twelve) hours. 01/13/16   Charlesetta Shanks, MD   BP 167/77 mmHg  Pulse 94  Temp(Src) 97.6 F (36.4 C) (Oral)  Resp 16  SpO2 100% Physical Exam  Constitutional:  Patient is mildly deconditioned but in good physical condition for age. She is alert and nontoxic. No respiratory distress.  HENT:  Head: Normocephalic and atraumatic.  Mouth/Throat: Oropharynx is clear and moist.  Eyes: EOM are normal.  Cardiovascular: Normal rate, regular rhythm, normal heart sounds and intact distal pulses.   Pulmonary/Chest: Effort normal and breath sounds normal.  Abdominal: Soft. Bowel sounds are normal. She exhibits no distension. There is no tenderness.  There is no rebound and no guarding.  Musculoskeletal: She exhibits no edema.  Back: Superficial abrasion to thoracic back at approximately the level of T11. This is lateral and approximately 4 cm in length. This has a dry surface and appears to be several days old. No surrounding hematoma or ecchymosis. Patient indicates significant tenderness with movement over her left lumbar back and sacroiliac region. This is reproduced by twisting and moving her legs. Patient has symmetric use of bilateral lower extremities and upper extremities. There are no deformities.  Neurological: She is alert. She exhibits normal muscle tone. Coordination normal.  Skin: Skin is warm and dry.  Psychiatric: She has a normal mood and affect.    ED Course  Procedures (including critical care time) Labs Review Labs Reviewed  BASIC METABOLIC PANEL - Abnormal; Notable for the following:    Glucose, Bld 101 (*)    GFR calc non Af Amer 51 (*)    GFR calc Af Amer 59 (*)    All other components within normal limits  CBC WITH DIFFERENTIAL/PLATELET - Abnormal; Notable for the following:    RBC 5.72 (*)    Hemoglobin 11.9 (*)    MCV 64.2 (*)    MCH 20.8 (*)    RDW 15.8 (*)    All other components within normal limits    Imaging Review Dg Hip Unilat With Pelvis 2-3 Views Right  01/13/2016  CLINICAL DATA:  R hip pain, no obvious deformity, dementia unaware if fell EXAM: DG HIP (WITH OR WITHOUT PELVIS) 2-3V RIGHT COMPARISON:  None. FINDINGS: There is no evidence of hip fracture or dislocation. Moderate diffuse osteopenia. There is no evidence of arthropathy or other focal bone abnormality. Mild spondylitic changes in the visualized lower lumbar spine. IMPRESSION: 1. Osteopenia with no definite fracture or other acute bone abnormality. 2. Spondylitic changes in the visualized lower lumbar spine. Electronically Signed   By: Lucrezia Europe M.D.   On: 01/13/2016 12:47   I have personally reviewed and evaluated these images and lab  results as part of my medical decision-making.   EKG Interpretation None      MDM   Final diagnoses:  Back strain, initial encounter   Patient's caregiver reports that the patient stays with her family members over the weekend. There was not a episode with a known fall but it was suspected and there is an identified area of abrasion on her back. X-rays do not show acute pelvis or hip fracture. He is able to transition to upright and sitting positions. She does experience pain in her sacroiliac region. She does not however appear to have acute neurologic deficit. On recheck, she appears much more comfortable and more mobile. She will be given Tylenol with Codeine for pain control.    Charlesetta Shanks, MD 01/13/16 [phone]

## 2016-01-13 NOTE — ED Notes (Signed)
Per pt caregiver, pt stays with daughter-in-law. Pt told caregiver that she had fallen Friday or Saturday. Unknown as to whether pt actually fell. Pt c/o pain right rib cage w/ lump noted. Pt also has breast cancer, unknown as to whether the pain is from fall or from cancer. Pt hx dementia. Caregiver reports pt is acting normal/at baseline.

## 2016-01-13 NOTE — ED Notes (Signed)
Pt verbalized understanding of d/c instructions, prescriptions, and follow-up care. No further questions/concerns, VSS, assisted to lobby in wheelchair.  

## 2016-01-13 NOTE — Discharge Instructions (Signed)

## 2016-01-13 NOTE — ED Notes (Signed)
Pt transported to xray 

## 2016-01-28 ENCOUNTER — Ambulatory Visit: Payer: Self-pay | Admitting: Oncology

## 2016-02-05 ENCOUNTER — Ambulatory Visit (HOSPITAL_BASED_OUTPATIENT_CLINIC_OR_DEPARTMENT_OTHER): Payer: Medicare Other | Admitting: Oncology

## 2016-02-05 ENCOUNTER — Telehealth: Payer: Self-pay | Admitting: Oncology

## 2016-02-05 VITALS — BP 149/65 | HR 66 | Resp 18 | Ht 60.0 in | Wt 100.4 lb

## 2016-02-05 DIAGNOSIS — C50512 Malignant neoplasm of lower-outer quadrant of left female breast: Secondary | ICD-10-CM

## 2016-02-05 DIAGNOSIS — C50911 Malignant neoplasm of unspecified site of right female breast: Secondary | ICD-10-CM | POA: Diagnosis not present

## 2016-02-05 MED ORDER — MIRTAZAPINE 15 MG PO TABS: 15.0000 mg | ORAL_TABLET | Freq: Every day | ORAL | Status: DC

## 2016-02-05 MED ORDER — TAMOXIFEN CITRATE 20 MG PO TABS: 20.0000 mg | ORAL_TABLET | Freq: Every day | ORAL | Status: AC

## 2016-02-05 NOTE — Telephone Encounter (Signed)
appt made and avs printed °

## 2016-02-05 NOTE — Progress Notes (Signed)
[place]  [phone] [phone]     ID: Carrie Cain DOB: Sep 20, 1923  MR#: [id number]  PPJ#:[id number]  Patient Care Team: Jonathon Jordan, MD as PCP - General (Family Medicine) Chauncey Cruel, MD as Consulting Physician (Oncology) PCP: Carrie Coma, MD GYN: SU: Rolm Bookbinder; Fanny Skates OTHER MD: Baltazar Apo, Jacelyn Pi  CHIEF COMPLAINT: Locally advanced breast cancer  CURRENT TREATMENT: Anastrozole   BREAST CANCER HISTORY: From the original intake note:  The patient was in [place] living with family members when she was found to have a lump in her left breast. We do not have any data regarding the workup and we do not know whether the patient had a biopsy at that time. However the patient was told she had breast cancer. She refused all further evaluation and all treatment.  Since that time the patient has moved to this area to be with her son Carrie Cain. Recently she complained of pain in the left breast. The family found that she had a fungating mass arising from the lower outer quadrant of her left breast. She was set up for mammography and ultrasonography at [facility], performed 09/05/2014. The breast composition was category 8. Mammography showed at least 2 separate masses in each breast, and bilateral axillary adenopathy. By ultrasonography there were 3 separate areas in the right breast, the largest measuring 1.7 cm, and 4 separate areas in the left breast, the largest measuring 4.6 cm. Again, bilateral axillary adenopathy was noted.  On 09/06/2014 biopsy of the largest left breast mass ([id number]) showed invasive ductal carcinoma, low-grade, with a prognostic panel pending.  The patient was scheduled to see Dr. Donne Hazel, but as he was not available she saw Dr. Dalbert Batman. He referred her here for further evaluation  INTERVAL HISTORY: I saw this patient last on 09/10/2014. Once her prognostic panel results came in,  which showed the patient's left breast cancer to be strongly estrogen and progesterone receptor positive ([id number]), the patient was started on anastrozole (November 2015.) The patient had an appointment with [place] January 2016 which she missed. More recently patient's caregiver called [12/17/2015] requesting a return appointment. In the interim, on 01/13/2016 the patient was seen in the emergency room after a possible fall, which appears not to have been witnessed. Right hip films showed no evidence of fracture.  On 04/19/2017I reevaluated Carrie Cain in the breast clinic accompanied by her caregiver, her daughter-in-law, and an interpreter. They tell me the patient initially had a very good response to her anti-estrogen, but now the cancer appears to be growing. She complains of chest pain. She also complains of back pain. The patient's functional status has decreased. She is no longer ambulating, but stays in bed almost all the time and it is difficult to get her to take a shower. She wears a pad in bed and usually urinates there. Bowel movements she may be able to tell them or they may prompt her but at least once there has been a BM in the pad. She has lost quite a bit of weight and is not eating very much. They're giving her Ensure twice daily. When asked directly the patient tells me she has no pain and that she has no problems. The family tells me her dementia also has progressed.  REVIEW OF SYSTEMS: Aside from the data noted above, the patient is unable to add to her review of systems today   PAST MEDICAL HISTORY: Past Medical History  Diagnosis Date  . Cancer of  breast (Carrie Cain)   . Macular degeneration   . Cardiomegaly   . Hyperlipidemia   . Hypertension     PAST SURGICAL HISTORY: No past surgical history on file.  FAMILY HISTORY Family History  Problem Relation Age of Onset  . Heart failure Son     deceased  . Cancer - Lung Son     deceased  . Breast cancer Daughter     deceased   . Hypertension Son   . Hyperlipidemia Son    the patient does not know the age of death or cause of death of her father. Her mother died at approximately age 14. The patient has 3 brothers and 3 sisters. 2 of the patient's brothers had cancer of the mouth. One of the patient's daughters was diagnosed with breast cancer at age 71. There is no other history of breast or ovarian cancer in the family.  GYNECOLOGIC HISTORY:  No LMP recorded. Patient is postmenopausal. Menarche between 70 and 29. First live birth age 13. The  patient is GX P6. Menopause came in her mid 49s.  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 She is widowed, and lived with her son Carrie Cain who was Teacher, English as a foreign language of a [employer] in [place]. However he died 2014/10/03 and she now lives with her daughter-in-law.Other children include Pearletha Alfred"), 3 years old, lives in [place]; Harish, who works as a Insurance underwriter; Bharat who died in war at the age of 54; Terrilee Croak, who lives in [place] and is currently unemployed; and Warden/ranger, worked as an Arboriculturist, but died of lung cancer at the age of 66.    ADVANCED DIRECTIVES: In place. Specifically the patient has a standing DO NOT RESUSCITATE order. Her son Carrie Cain was her healthcare power of attorney. There doesn't seem to be a clear healthcare part of attorney at present (02/05/2016). This was discussed with the patient's daughter-in-law will discuss it with the rest of the family   HEALTH MAINTENANCE: Social History  Substance Use Topics  . Smoking status: Never Smoker   . Smokeless tobacco: Not on file  . Alcohol Use: No     Colonoscopy:  PAP:  Bone density:  Lipid panel:  Allergies  Allergen Reactions  . Sulfa Antibiotics Rash    Current Outpatient Prescriptions  Medication Sig Dispense Refill  . acetaminophen-codeine (TYLENOL #3) 300-30 MG tablet Take 1-2 tablets by mouth every 6 (six) hours as needed for moderate pain. 30 tablet 0  .  Acetaminophen-Codeine (TYLENOL WITH CODEINE #3 PO) Take 1 tablet by mouth 4 (four) times daily as needed.    Marland Kitchen anastrozole (ARIMIDEX) 1 MG tablet Take 1 tablet (1 mg total) by mouth daily. 90 tablet 0  . atenolol (TENORMIN) 50 MG tablet Take 75 mg by mouth daily. 1 1/2 tablets daily  5  . diclofenac sodium (VOLTAREN) 1 % GEL Apply topically 4 (four) times daily.    Marland Kitchen docusate sodium (COLACE) 100 MG capsule Take 1 capsule (100 mg total) by mouth every 12 (twelve) hours. 60 capsule 0  . furosemide (LASIX) 20 MG tablet Take 20 mg by mouth daily.  0  . levothyroxine (SYNTHROID, LEVOTHROID) 125 MCG tablet Take 125 mcg by mouth daily before breakfast.    . meclizine (ANTIVERT) 25 MG tablet Take 25 mg by mouth 3 (three) times daily as needed for dizziness.    . Multiple Vitamins-Minerals ([place] 50+) CAPS Take by mouth.    . Multiple Vitamins-Minerals (ONE-A-DAY WOMENS 50 PLUS PO)  Take by mouth once.    Marland Kitchen omeprazole (PRILOSEC) 20 MG capsule Take 1 capsule (20 mg total) by mouth at bedtime. 30 capsule 12  . polyvinyl alcohol (LIQUIFILM TEARS) 1.4 % ophthalmic solution 1 drop as needed for dry eyes (prn post avastin pain).    Marland Kitchen UNABLE TO FIND avastin- ocular injection weekly x 5     No current facility-administered medications for this visit.    OBJECTIVE: Elderly [place] Asian woman in no acute distress Filed Vitals:   02/05/16 1557  BP: 149/65  Pulse: 66  Resp: 18     Body mass index is 19.61 kg/(m^2).    ECOG FS:3 - Symptomatic, >50% confined to bed   Filed Weights   02/05/16 1557  Weight: 100 lb 6.4 oz (45.541 kg)  Weighed in 2015 was 130 pounds  Sclerae unicteric, EOMs intact Oropharynx clear and moist No cervical or supraclavicular adenopathy Lungs no rales or rhonchi Heart regular rate and rhythm Abd soft, nontender, positive bowel sounds MSK severe kyphosis but no focal spinal tenderness, no upper extremity lymphedema Neuro: nonfocal, alert, answers simple questions and  follows commands, pleasant affect Breasts: In the right breast there are at least 2 small masses, and there is a 3-4 cm axillary mass which is movable. There are no skin changes, and no erythema. In the left breast I do not palpate a breast mass. The left axilla is benign.    LAB RESULTS:  CMP     Component Value Date/Time   NA 140 01/13/2016 1038   NA 140 09/10/2014 1601   K 3.5 01/13/2016 1038   K 3.7 09/10/2014 1601   CL 101 01/13/2016 1038   CO2 27 01/13/2016 1038   CO2 26 09/10/2014 1601   GLUCOSE 101* 01/13/2016 1038   GLUCOSE 108 09/10/2014 1601   BUN 19 01/13/2016 1038   BUN 18.6 09/10/2014 1601   CREATININE 0.96 01/13/2016 1038   CREATININE 0.8 09/10/2014 1601   CALCIUM 8.9 01/13/2016 1038   CALCIUM 8.8 09/10/2014 1601   PROT 6.7 09/10/2014 1601   ALBUMIN 3.1* 09/10/2014 1601   AST 15 09/10/2014 1601   ALT 6 09/10/2014 1601   ALKPHOS 107 09/10/2014 1601   BILITOT 0.83 09/10/2014 1601   GFRNONAA 51* 01/13/2016 1038   GFRAA 59* 01/13/2016 1038    INo results found for: SPEP, UPEP  Lab Results  Component Value Date   WBC 8.8 01/13/2016   NEUTROABS 5.7 01/13/2016   HGB 11.9* 01/13/2016   HCT 36.7 01/13/2016   MCV 64.2* 01/13/2016   PLT 270 01/13/2016      Chemistry      Component Value Date/Time   NA 140 01/13/2016 1038   NA 140 09/10/2014 1601   K 3.5 01/13/2016 1038   K 3.7 09/10/2014 1601   CL 101 01/13/2016 1038   CO2 27 01/13/2016 1038   CO2 26 09/10/2014 1601   BUN 19 01/13/2016 1038   BUN 18.6 09/10/2014 1601   CREATININE 0.96 01/13/2016 1038   CREATININE 0.8 09/10/2014 1601      Component Value Date/Time   CALCIUM 8.9 01/13/2016 1038   CALCIUM 8.8 09/10/2014 1601   ALKPHOS 107 09/10/2014 1601   AST 15 09/10/2014 1601   ALT 6 09/10/2014 1601   BILITOT 0.83 09/10/2014 1601       No results found for: LABCA2  No components found for: LABCA125  No results for input(s): INR in the last 168 hours.  Urinalysis No results found for:  COLORURINE, APPEARANCEUR, LABSPEC, [place], GLUCOSEU, HGBUR, BILIRUBINUR, KETONESUR, PROTEINUR, UROBILINOGEN, NITRITE, LEUKOCYTESUR  STUDIES: Dg Hip Unilat With Pelvis 2-3 Views Right  01/13/2016  CLINICAL DATA:  R hip pain, no obvious deformity, dementia unaware if fell EXAM: DG HIP (WITH OR WITHOUT PELVIS) 2-3V RIGHT COMPARISON:  None. FINDINGS: There is no evidence of hip fracture or dislocation. Moderate diffuse osteopenia. There is no evidence of arthropathy or other focal bone abnormality. Mild spondylitic changes in the visualized lower lumbar spine. IMPRESSION: 1. Osteopenia with no definite fracture or other acute bone abnormality. 2. Spondylitic changes in the visualized lower lumbar spine. Electronically Signed   By: Lucrezia Europe M.D.   On: 01/13/2016 12:47    ASSESSMENT: 82 y.o. [place] woman with bilateral breast cancer diagnosed with left breast cancer in [place] in 2009 (not clear if biopsy obtained) now with mammography and ultrasonography 09/05/2014 showing  (a) on the Right, clinically multifocal T1c N1, stage IIA breast disease  (b) on the Left, clinically multifocal T2 N1, stage IIB breast disease (T4 N1 or stage IIIB by exam),  (1) biopsy of the 4.6 cm left lower outer quadrant breast mass 09/06/2014 confirmed invasive ductal carcinoma, low-grade,   estrogen and progesterone receptor positive, HER-2 nonamplified, with an MIB-1 of 14%.  (2) anastrozole started November 2015, discontinued April 2017 with progression  (3) tamoxifen started April 2017  PLAN: Ms. Mclarty  situation is complex, especially since she is unable to fully participate in her care. Partly this is language issues and partly it is dementia. She does have a good caregiver and her daughter-in-law is very concerned. We spent approximately 50 minutes trying to clarify goals and set up a plan.  The patient looks superficially well and certainly does not look moribund at all. I do not believe she is  hospice appropriate although I would like to place an in-home palliative care consult. I think she would benefit from a bedside commode and hospital bed. They would be interested in physical therapy for her as well.   The patient does have a DO NOT RESUSCITATE order in place. This is appropriate. However note that any treatment would give for her symptoms, also we will lengthen her life at least potentially. The 2 goals are intertwined.  I am not convinced that she has significant pain associated with her breast masses or axillary mass. There was no pain at all when I palpated or examined. She does have back pain and she has severe kyphosis. I suspect she has multiple compression fractures. As far as pain is concerned I suggested they get off the Tylenol 3 and instead use Tylenol 1 tablet 3 times a day and if necessary they can add Aleve 1 tablet 3 times a day with meals. If she takes these 6 tablets daily and still has significant pain then we can add other medications, most likely tramadol.  The legal situation is unclear. The patient's husband was her healthcare power of attorney but he is deceased. The patient's chief caregiver, her daughter-in-law, does not have healthcare part of attorney and there are several other children none of whom live in town. I suggested to the patient's daughter-in-law that she contact her lawyer and ask for advice on how to proceed. Ideally the family would agree on one person being the healthcare part of attorney and ideally that person would be the one who is here in town most directly involved with the patient's care.  I think it would be helpful for the patient  to start Remeron at bedtime and I have placed that prescription. I am changing her from anastrozole to tamoxifen and those prescriptions have been placed. I am not placing a surgery referral at this time, but if we don't see at least disease control on tamoxifen when the patient returns to see me in 2 months, we  may have to consider surgery for local control. As stated the patient does not appear to have a well-defined end-of-life. And what we would not want this to have uncontrolled local progression of disease.  The family is in agreement with this plan. The patient will return to see me in 2 months. They know to call for any other problems that may develop before that visit.    Chauncey Cruel, MD   02/05/2016 4:21 PM

## 2016-02-06 ENCOUNTER — Other Ambulatory Visit: Payer: Self-pay

## 2016-02-06 ENCOUNTER — Telehealth: Payer: Self-pay

## 2016-02-06 DIAGNOSIS — R0600 Dyspnea, unspecified: Secondary | ICD-10-CM

## 2016-02-06 DIAGNOSIS — C50512 Malignant neoplasm of lower-outer quadrant of left female breast: Secondary | ICD-10-CM

## 2016-02-06 DIAGNOSIS — IMO0002 Reserved for concepts with insufficient information to code with codable children: Secondary | ICD-10-CM

## 2016-02-06 DIAGNOSIS — I5032 Chronic diastolic (congestive) heart failure: Secondary | ICD-10-CM

## 2016-02-06 NOTE — Telephone Encounter (Signed)
Patient referred to [facility]  Per Dr. Jana Hakim.  Order placed with home care for bedside commode, hospital bed and PT.

## 2016-02-14 ENCOUNTER — Telehealth: Payer: Self-pay | Admitting: *Deleted

## 2016-02-14 NOTE — Telephone Encounter (Signed)
This RN contacted pt's caregiver/daughter in law per her call to [facility] due to increased dementia since starting the remeron.  Santiago Glad states medication was started 2 days ago with noted increased appetite but " dementia 100 times worse "  Pt fell during the night - Santiago Glad is unsure how long she was on the floor " because it was when I woke up at 5 am that I heard her calling "  Pt is uninjured per Santiago Glad who was able to assist pt back to bed.  Per phone conversation Santiago Glad also states she( herself ) is becoming aware of probable need for more round the clock care for patient- Santiago Glad would like to speak with someone regarding SNF- and cost. Santiago Glad states " she doesn't have any money except for the medicare so I need to know cost compared to bringing more people into the home ".  Plan per this call: Remeron will be stopped- Santiago Glad will monitor dementia and if worsens will call over the weekend if needed for further advice. This RN informed pt pallative home health would be contacted per above concerns as well.  This RN spoke with Billey Chang- NP per above. He will have Pallative SW contact Santiago Glad to discuss and if needed meet with Santiago Glad.

## 2016-02-18 ENCOUNTER — Telehealth: Payer: Self-pay

## 2016-02-18 NOTE — Telephone Encounter (Signed)
Santiago Glad called about her mother-in-law. The pt is still "off the chain." Santiago Glad s/w Val 4 days ago and they stopped the mirtazapine at that time. The pt has not improved at all. She is still taking her new medication tamoxifen. She does have an appt with Dr Stephanie Acre next week Thursday to see if she has an official diagnosis of dementia. Pt has had episodes about 1x every 2-3 weeks of waking up confused and able to be reoriented. She is now pretty much constantly confused. She has stopped speaking english and reverted to her cultural language of [place], she does not recognize Santiago Glad, she is looking for her deceased husband around the house, she has fallen. Is there anything else they need to do between now and her visit with Dr Stephanie Acre? Do they need to stop the tamoxifen? Do they need to give more time for mirtazapine to be cleared from her system?

## 2016-02-18 NOTE — Telephone Encounter (Signed)
This RN per MD recommendation contacted Santiago Glad- spoke with Montel Clock who assist in care per Santiago Glad not available.  Informed Venese per MD to stop the tamoxifen- and they may want to contact primary MD with concerns for possible need to be seen prior to current appointment.  Vanese stated Phelan has visited this am.  No other needs at this time.  Caregivers aware to call if needed.

## 2016-02-20 ENCOUNTER — Telehealth: Payer: Self-pay | Admitting: *Deleted

## 2016-02-20 ENCOUNTER — Other Ambulatory Visit: Payer: Self-pay | Admitting: *Deleted

## 2016-02-20 DIAGNOSIS — F03918 Unspecified dementia, unspecified severity, with other behavioral disturbance: Secondary | ICD-10-CM

## 2016-02-20 DIAGNOSIS — C50512 Malignant neoplasm of lower-outer quadrant of left female breast: Secondary | ICD-10-CM

## 2016-02-20 DIAGNOSIS — F0391 Unspecified dementia with behavioral disturbance: Secondary | ICD-10-CM

## 2016-02-21 NOTE — Telephone Encounter (Signed)
Per discussion with Billey Chang NP with [facility]- recommended dementia work up be obtained per Dr Tish Frederickson for best outcome.  Referral placed per MD ok.

## 2016-03-05 ENCOUNTER — Encounter: Payer: Self-pay | Admitting: *Deleted

## 2016-03-06 ENCOUNTER — Encounter: Payer: Self-pay | Admitting: Diagnostic Neuroimaging

## 2016-03-06 ENCOUNTER — Ambulatory Visit (INDEPENDENT_AMBULATORY_CARE_PROVIDER_SITE_OTHER): Payer: Medicare Other | Admitting: Diagnostic Neuroimaging

## 2016-03-06 VITALS — BP 105/61 | HR 54 | Ht <= 58 in | Wt 99.0 lb

## 2016-03-06 DIAGNOSIS — F0391 Unspecified dementia with behavioral disturbance: Secondary | ICD-10-CM | POA: Diagnosis not present

## 2016-03-06 DIAGNOSIS — R159 Full incontinence of feces: Secondary | ICD-10-CM | POA: Diagnosis not present

## 2016-03-06 DIAGNOSIS — F0390 Unspecified dementia without behavioral disturbance: Secondary | ICD-10-CM | POA: Diagnosis not present

## 2016-03-06 DIAGNOSIS — C50512 Malignant neoplasm of lower-outer quadrant of left female breast: Secondary | ICD-10-CM

## 2016-03-06 DIAGNOSIS — F03B18 Unspecified dementia, moderate, with other behavioral disturbance: Secondary | ICD-10-CM

## 2016-03-06 DIAGNOSIS — F03C0 Unspecified dementia, severe, without behavioral disturbance, psychotic disturbance, mood disturbance, and anxiety: Secondary | ICD-10-CM

## 2016-03-06 MED ORDER — QUETIAPINE FUMARATE 25 MG PO TABS: 12.5000 mg | ORAL_TABLET | Freq: Two times a day (BID) | ORAL | Status: DC | PRN

## 2016-03-06 MED ORDER — SERTRALINE HCL 25 MG PO TABS: 25.0000 mg | ORAL_TABLET | Freq: Every day | ORAL | Status: DC

## 2016-03-06 NOTE — Progress Notes (Signed)
[facility]  PATIENT: Carrie Cain DOB: 18-Jan-1923  REFERRING CLINICIAN: Magrinat, G  HISTORY FROM: patient's daughter in law Santiago Glad), patient (via interpreter) REASON FOR VISIT: new consult    HISTORICAL  CHIEF COMPLAINT:  Chief Complaint  Patient presents with  . Dementia    with behavioral disturbance, rm 7, New Pt, dgtr- Santiago Glad, interpreter- Delman Cheadle, MMSE 13    HISTORY OF PRESENT ILLNESS:   82 year old female here for evaluation of dementia. Patient has been living in thefor 20 years. For the past 11 years patient has been having significant short-term memory loss, orientation difficulty, decreased mobility, decreased activities of daily living. During this time patient has been living with patient's son and daughter-in-law. Patient's son passed away a few years ago. Patient's daughter-in-law here and his primary caregiver. Patient has several other children who live in other parts of the [place] and are involved in care.  02/10/2016 patient had significant new onset of more agitation, severe memory loss. 3 nights per week patient tends to wake up and wander around the home. Patient has some hours of caregiver support with an aide at home. However patient is becoming uncooperative with the aid sometimes pushing and hitting the nurse or a. This been going on for at least one year.  Patient also has history of breast cancer, heart disease, macular degeneration, osteoporosis, hypothyroidism, anemia, urinary and bowel incontinence, hearing loss, back pain, hypertension.   REVIEW OF SYSTEMS: Full 14 system review of systems performed and negative with exception of: Weight loss fatigue blurred vision loss of vision shortness breath in, his diarrhea constipation aching muscles joint pain to much sleep not asleep decreased energy distances activities insomnia memory loss confusion weakness dizziness itching.  ALLERGIES: Allergies  Allergen Reactions  .  Sulfa Antibiotics Rash  . Remeron [Mirtazapine] Other (See Comments)    Increased dementia issues, symptoms, agitated, combative    HOME MEDICATIONS: Outpatient Prescriptions Prior to Visit  Medication Sig Dispense Refill  . atenolol (TENORMIN) 50 MG tablet Take 75 mg by mouth daily. 1 1/2 tablets daily  5  . diclofenac sodium (VOLTAREN) 1 % GEL Apply topically 4 (four) times daily.    . furosemide (LASIX) 20 MG tablet Take 20 mg by mouth daily.  0  . levothyroxine (SYNTHROID, LEVOTHROID) 125 MCG tablet Take 125 mcg by mouth daily before breakfast.    . meclizine (ANTIVERT) 25 MG tablet Take 25 mg by mouth 3 (three) times daily as needed for dizziness.    . Multiple Vitamins-Minerals ([place] 50+) CAPS Take by mouth.    . Multiple Vitamins-Minerals (ONE-A-DAY WOMENS 50 PLUS PO) Take by mouth once.    Marland Kitchen omeprazole (PRILOSEC) 20 MG capsule Take 1 capsule (20 mg total) by mouth at bedtime. 30 capsule 12  . polyvinyl alcohol (LIQUIFILM TEARS) 1.4 % ophthalmic solution 1 drop as needed for dry eyes (prn post avastin pain).    . tamoxifen (NOLVADEX) 20 MG tablet Take 1 tablet (20 mg total) by mouth daily. 90 tablet 12  . UNABLE TO FIND avastin- ocular injection weekly x 5    . mirtazapine (REMERON) 15 MG tablet Take 1 tablet (15 mg total) by mouth at bedtime. 90 tablet 4   No facility-administered medications prior to visit.    PAST MEDICAL HISTORY: Past Medical History  Diagnosis Date  . Cancer of breast ([place])   . Macular degeneration   . Cardiomegaly   . Hyperlipidemia   . Hypertension   . Hypothyroid   .  Anemia   . Osteoporosis   . Hearing loss   . Incontinence     bowel bladder    PAST SURGICAL HISTORY: History reviewed. No pertinent past surgical history.  FAMILY HISTORY: Family History  Problem Relation Age of Onset  . Heart failure Son     deceased  . Cancer - Lung Son     deceased  . Breast cancer Daughter     deceased  . Osteoporosis Daughter   .  Hypertension Son   . Hyperlipidemia Son   . Heart attack Father   . Depression Other   . Bipolar disorder Other     SOCIAL HISTORY:  Social History   Social History  . Marital Status: Widowed    Spouse Name: N/A  . Number of Children: 6  . Years of Education: N/A   Occupational History  .      home maker   Social History Main Topics  . Smoking status: Never Smoker   . Smokeless tobacco: Not on file  . Alcohol Use: No  . Drug Use: No  . Sexual Activity: Not Currently   Other Topics Concern  . Not on file   Social History Narrative   Lives with daughter-in-law, Santiago Glad   Caffeine use-tea 2 cups daily     PHYSICAL EXAM   GENERAL EXAM/CONSTITUTIONAL: Vitals:  Filed Vitals:   03/06/16 0936  BP: 105/61  Pulse: 54  Height: 4\' 8"  (1.422 m)  Weight: 99 lb (44.906 kg)     Body mass index is 22.21 kg/(m^2).  No exam data present  Patient is in no distress; well developed, nourished and groomed; neck is supple  CARDIOVASCULAR:  Examination of carotid arteries is normal; no carotid bruits  Regular rate and rhythm, no murmurs  Examination of peripheral vascular system by observation and palpation is normal  EYES:  Ophthalmoscopic exam of optic discs and posterior segments is normal; no papilledema or hemorrhages  MUSCULOSKELETAL:  Gait, strength, tone, movements noted in Neurologic exam below  NEUROLOGIC: MENTAL STATUS:  MMSE - [place] Exam 03/06/2016  Not completed: (No Data)  Orientation to time 2  Orientation to Place 1  Registration 3  Attention/ Calculation 0  Recall 0  Language- name 2 objects 2  Language- repeat 1  Language- follow 3 step command 3  Language- read & follow direction 1  Write a sentence 0  Copy design 0  Total score 13    awake, alert, oriented to person; SPEAKS HINDI; UNDERSTANDS ADN [place] recent and remote memory intact  DECR attention and concentration  DECR language fluent,  comprehension intact, naming intact,   CRANIAL NERVE:   2nd - no papilledema on fundoscopic exam  2nd, 3rd, 4th, 6th - pupils equal and reactive to light, visual fields full to confrontation, extraocular muscles intact, no nystagmus  5th - facial sensation symmetric  7th - facial strength symmetric  8th - hearing intact  9th - palate elevates symmetrically, uvula midline  11th - shoulder shrug symmetric  12th - tongue protrusion midline  MOTOR:   normal bulk and tone, full strength in the BUE, BLE  SENSORY:   normal and symmetric to light touch, pinprick, temperature, vibration  COORDINATION:   finger-nose-finger, fine finger movements normal  REFLEXES:   deep tendon reflexes present and symmetric  GAIT/STATION:   narrow based gait; able to walk on toes, heels and tandem; romberg is negative    DIAGNOSTIC DATA (LABS, IMAGING, TESTING) -  I reviewed patient records, labs, notes, testing and imaging myself where available.  Lab Results  Component Value Date   WBC 8.8 01/13/2016   HGB 11.9* 01/13/2016   HCT 36.7 01/13/2016   MCV 64.2* 01/13/2016   PLT 270 01/13/2016      Component Value Date/Time   NA 140 01/13/2016 1038   NA 140 09/10/2014 1601   K 3.5 01/13/2016 1038   K 3.7 09/10/2014 1601   CL 101 01/13/2016 1038   CO2 27 01/13/2016 1038   CO2 26 09/10/2014 1601   GLUCOSE 101* 01/13/2016 1038   GLUCOSE 108 09/10/2014 1601   BUN 19 01/13/2016 1038   BUN 18.6 09/10/2014 1601   CREATININE 0.96 01/13/2016 1038   CREATININE 0.8 09/10/2014 1601   CALCIUM 8.9 01/13/2016 1038   CALCIUM 8.8 09/10/2014 1601   PROT 6.7 09/10/2014 1601   ALBUMIN 3.1* 09/10/2014 1601   AST 15 09/10/2014 1601   ALT 6 09/10/2014 1601   ALKPHOS 107 09/10/2014 1601   BILITOT 0.83 09/10/2014 1601   GFRNONAA 51* 01/13/2016 1038   GFRAA 59* 01/13/2016 1038   No results found for: CHOL, HDL, LDLCALC, LDLDIRECT, TRIG, CHOLHDL No results found for: HGBA1C No results found  for: VITAMINB12 No results found for: TSH  10/25/09 CT soft tissue neck [I reviewed images myself and agree with interpretation. -VRP]  - No significant pathologic finding noted in the neck. The patient has had thyroidectomy. No evidence of residual or recurrent mass. I do not see any mass lesion or cyst impinging upon the esophagus. The major vessels do show atherosclerotic change and tortuosity and it is possible that the left subclavian artery could have some mass effect upon the left side of the esophagus at the thoracic inlet region. I would not have commented on this in the absence of the described clinical history however. At the bottom of the region imaged in the mid to lower chest, the posterior mediastinum appears abnormal. There could be a hiatal hernia. This area was not primarily or completely evaluated.   08/21/14 CXR  - Slightly increasing bibasilar opacities probably representing atelectasis but pneumonia is not entirely excluded.  - Peribronchial thickening which is likely chronic. - Large hiatal hernia and remote thoracic spine compression fractures again noted.   ASSESSMENT AND PLAN  82 y.o. year old female here with moderate to severe dementia with behavioral disturbance. Symptoms present at least since late 1990s. Significant cognitive, physical and functional decline.   Ddx: moderate - severe dementia with behavioral disturbance  1. Moderate dementia with behavioral disturbance   2. Breast cancer of lower-outer quadrant of left female breast ([place])   3. Bowel incontinence   4. Severe dementia     PLAN: - consider sertraline for mood stabilization - consider quetiapine 12.5-25mg  twice a day as needed for severe agitation (cautions reviewed with patient's daughter-in-law) - patient not able to make decisions for herself; patient is dependent on others for all ADLs (bathing, dressing, eating) - we will fill forms and are available to support patient and  daughter-in-law in any way we can; resources reviewed as well  Meds ordered this encounter  Medications  . sertraline (ZOLOFT) 25 MG tablet    Sig: Take 1 tablet (25 mg total) by mouth daily.    Dispense:  30 tablet    Refill:  6  . QUEtiapine (SEROQUEL) 25 MG tablet    Sig: Take 0.5-1 tablets (12.5-25 mg total) by mouth 2 (two) times daily as needed.  Dispense:  30 tablet    Refill:  3   Return if symptoms worsen or fail to improve.    Penni Bombard, MD [id number], [id number] AM Certified in Neurology, Neurophysiology and Neuroimaging  [facility] [address] [place], [place] [address] [phone]

## 2016-03-06 NOTE — Patient Instructions (Addendum)
-   consider sertraline 25MG  DAILY for mood stabilization  - consider quetiapine 12.5-25mg  twice a day as needed for severe agitation (cautions reviewed for use in elderly patient's with dementia) - patient not able to make decisions for herself; patient is dependent on others for all ADLs (bathing, dressing, eating)

## 2016-03-09 ENCOUNTER — Telehealth: Payer: Self-pay

## 2016-03-09 NOTE — Telephone Encounter (Signed)
Carrie Cain called regarding PT for patient.  She was going to discharge her last week however patient's daughter feels that she has improved greatly.  She is able to ambulate to and from the BR she also is able to get around the house with assistants.  Patient's daughter would like PT to help patient to learn how to ambulate outside so her daughter can get her outside on nice days. Patient will continue PT until further notice.

## 2016-03-26 ENCOUNTER — Telehealth: Payer: Self-pay | Admitting: Oncology

## 2016-03-26 ENCOUNTER — Ambulatory Visit (HOSPITAL_BASED_OUTPATIENT_CLINIC_OR_DEPARTMENT_OTHER): Payer: Medicare Other | Admitting: Nurse Practitioner

## 2016-03-26 ENCOUNTER — Encounter: Payer: Self-pay | Admitting: Nurse Practitioner

## 2016-03-26 VITALS — BP 133/67 | HR 52 | Temp 97.6°F | Resp 18 | Ht <= 58 in | Wt 99.8 lb

## 2016-03-26 DIAGNOSIS — R159 Full incontinence of feces: Secondary | ICD-10-CM | POA: Diagnosis not present

## 2016-03-26 DIAGNOSIS — C50512 Malignant neoplasm of lower-outer quadrant of left female breast: Secondary | ICD-10-CM

## 2016-03-26 DIAGNOSIS — C50811 Malignant neoplasm of overlapping sites of right female breast: Secondary | ICD-10-CM

## 2016-03-26 DIAGNOSIS — M549 Dorsalgia, unspecified: Secondary | ICD-10-CM | POA: Diagnosis not present

## 2016-03-26 DIAGNOSIS — Z7409 Other reduced mobility: Secondary | ICD-10-CM

## 2016-03-26 DIAGNOSIS — C50911 Malignant neoplasm of unspecified site of right female breast: Secondary | ICD-10-CM | POA: Diagnosis not present

## 2016-03-26 DIAGNOSIS — K6289 Other specified diseases of anus and rectum: Secondary | ICD-10-CM

## 2016-03-26 MED ORDER — TRAMADOL HCL 50 MG PO TABS: 50.0000 mg | ORAL_TABLET | Freq: Four times a day (QID) | ORAL | Status: DC | PRN

## 2016-03-26 NOTE — Telephone Encounter (Signed)
appt made and avs printed °

## 2016-03-26 NOTE — Progress Notes (Signed)
[facility]  [phone] Fax:(336) [phone]     ID: Carrie Cain DOB: May 17, 1923  MR#: [id number]  LPF#:[id number]  Patient Care Team: Carrie Jordan, MD as PCP - General (Family Medicine) Carrie Cruel, MD as Consulting Physician (Oncology) PCP: Carrie Coma, MD GYN: SU: Carrie Cain; Carrie Cain OTHER MD: Carrie Cain, Carrie Cain  CHIEF COMPLAINT: Locally advanced breast cancer  CURRENT TREATMENT: tamoxifen   BREAST CANCER HISTORY: From the original intake note:  The patient was in [place] living with family members when she was found to have a lump in her left breast. We do not have any data regarding the workup and we do not know whether the patient had a biopsy at that time. However the patient was told she had breast cancer. She refused all further evaluation and all treatment.  Since that time the patient has moved to this area to be with her son Carrie Cain. Recently she complained of pain in the left breast. The family found that she had a fungating mass arising from the lower outer quadrant of her left breast. She was set up for mammography and ultrasonography at [facility], performed 09/05/2014. The breast composition was category 8. Mammography showed at least 2 separate masses in each breast, and bilateral axillary adenopathy. By ultrasonography there were 3 separate areas in the right breast, the largest measuring 1.7 cm, and 4 separate areas in the left breast, the largest measuring 4.6 cm. Again, bilateral axillary adenopathy was noted.  On 09/06/2014 biopsy of the largest left breast mass ([id number]) showed invasive ductal carcinoma, low-grade, with a prognostic panel pending.  The patient was scheduled to see Dr. Donne Cain, but as he was not available she saw Dr. Dalbert Cain. He referred her here for further evaluation  INTERVAL HISTORY: Carrie Cain returns today for follow up of hr bilateral breast cancers, accompanied by  her daughter-in-law and nurse aide. These 2 individuals are performing the review of systems and interval history, as the patient has severe dementia and is not able to answer my questions. She is able to make simple commands during our visit ("more water" "lets go home") in [place]. A Hindi interpreter is available but not utilized. She is pleasant, but the nurse aide says she does try to bite her during every shower. Carrie Cain caregiver have avoided use of the zoloft and seroquel she has been prescribed. They fear it will interact with her other medicines.   The patient has been on tamoxifen since February. She seems to tolerate this well. Her main physical complaint is multifocal back pain. She has a history of arthritis to the back and receives 1 aleve in the morning when she is most stiff. She does not complain of pain elsewhere generally. She spends most of her time in bed, but has fallen twice in the past 3 weeks when she has gotten up without assistance. She is incontinent of urine, and now constantly leaking stool. The nurse aide tells me her anal sphincter is now partially agape and causes the patient great pain. They have been told this is related to her spinal issues. Her appetite is poor, but she drinks well. She drinking one Ensure shake daily. She sleeps through the night and is fairly alert during the day.   REVIEW OF SYSTEMS: Aside from the data noted above, the patient is unable to add to her review of systems today   PAST MEDICAL HISTORY: Past Medical History  Diagnosis Date  . Cancer of breast ([place])   .  Macular degeneration   . Cardiomegaly   . Hyperlipidemia   . Hypertension   . Hypothyroid   . Anemia   . Osteoporosis   . Hearing loss   . Incontinence     bowel bladder    PAST SURGICAL HISTORY: No past surgical history on file.  FAMILY HISTORY Family History  Problem Relation Age of Onset  . Heart failure Son     deceased  . Cancer - Lung Son     deceased  .  Breast cancer Daughter     deceased  . Osteoporosis Daughter   . Hypertension Son   . Hyperlipidemia Son   . Heart attack Father   . Depression Other   . Bipolar disorder Other    the patient does not know the age of death or cause of death of her father. Her mother died at approximately age 28. The patient has 3 brothers and 3 sisters. 2 of the patient's brothers had cancer of the mouth. One of the patient's daughters was diagnosed with breast cancer at age 73. There is no other history of breast or ovarian cancer in the family.  GYNECOLOGIC HISTORY:  No LMP recorded. Patient is postmenopausal. Menarche between 56 and 14. First live birth age 42. The  patient is GX P6. Menopause came in her mid 1s.  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 She is widowed, and lived with her son Carrie Cain who was Teacher, English as a foreign language of a [place]. However he died 11-06-2014 and she now lives with her daughter-in-law.Other children include Carrie Cain"), 45 years old, lives in [place]; Carrie Cain, who works as a Insurance underwriter; Carrie Cain who died in war at the age of 39; Carrie Cain, who lives in [place] and is currently unemployed; and Carrie Cain, worked as an Arboriculturist, but died of lung cancer at the age of 69.    ADVANCED DIRECTIVES: In place. Specifically the patient has a standing DO NOT RESUSCITATE order. Her son Carrie Cain was her healthcare power of attorney. There doesn't seem to be a clear healthcare part of attorney at present (02/05/2016). This was discussed with the patient's daughter-in-law will discuss it with the rest of the family   HEALTH MAINTENANCE: Social History  Substance Use Topics  . Smoking status: Never Smoker   . Smokeless tobacco: Not on file  . Alcohol Use: No     Colonoscopy:  PAP:  Bone density:  Lipid panel:  Allergies  Allergen Reactions  . Sulfa Antibiotics Rash  . Remeron [Mirtazapine] Other (See Comments)    Increased dementia issues, symptoms,  agitated, combative    Current Outpatient Prescriptions  Medication Sig Dispense Refill  . atenolol (TENORMIN) 50 MG tablet Take 75 mg by mouth daily. 1 1/2 tablets daily  5  . diclofenac sodium (VOLTAREN) 1 % GEL Apply topically 4 (four) times daily as needed.     . furosemide (LASIX) 20 MG tablet Take 20 mg by mouth daily.  0  . levothyroxine (SYNTHROID, LEVOTHROID) 88 MCG tablet Take 88 mcg by mouth daily.  5  . Multiple Vitamins-Minerals ([place]UV[place] ADULT 50+) CAPS Take by mouth.    . Multiple Vitamins-Minerals (ONE-A-DAY WOMENS 50 PLUS PO) Take by mouth once.    . Naproxen Sodium (ALEVE PO) Take 125 mg by mouth daily.     . polyvinyl alcohol (LIQUIFILM TEARS) 1.4 % ophthalmic solution 1 drop as needed for dry eyes (prn post avastin pain).    . tamoxifen (NOLVADEX) 20 MG  tablet Take 20 mg by mouth daily.    Marland Kitchen UNABLE TO FIND avastin- ocular injection weekly x 5    . omeprazole (PRILOSEC) 20 MG capsule Take 1 capsule (20 mg total) by mouth at bedtime. (Patient not taking: Reported on 03/26/2016) 30 capsule 12  . QUEtiapine (SEROQUEL) 25 MG tablet Take 0.5-1 tablets (12.5-25 mg total) by mouth 2 (two) times daily as needed. (Patient not taking: Reported on 03/26/2016) 30 tablet 3  . sertraline (ZOLOFT) 25 MG tablet Take 1 tablet (25 mg total) by mouth daily. (Patient not taking: Reported on 03/26/2016) 30 tablet 6  . traMADol (ULTRAM) 50 MG tablet Take 1 tablet (50 mg total) by mouth every 6 (six) hours as needed. 30 tablet 0   No current facility-administered medications for this visit.    OBJECTIVE: Elderly [place] Asian woman in no acute distress Filed Vitals:   03/26/16 1440  BP: 133/67  Pulse: 52  Temp: 97.6 F (36.4 C)  Resp: 18     Body mass index is 22.39 kg/(m^2).    ECOG FS:3 - Symptomatic, >50% confined to bed   Filed Weights   03/26/16 1440  Weight: 99 lb 12.8 oz (45.269 kg)  Weighed in 2015 was 130 pounds   Skin: warm, dry  HEENT: sclerae anicteric, conjunctivae pink,  oropharynx clear. No thrush or mucositis.  Lymph Nodes: No cervical or supraclavicular lymphadenopathy  Lungs: clear to auscultation bilaterally, no rales, wheezes, or rhonci  Heart: regular rate and rhythm  Abdomen: round, soft, non tender, positive bowel sounds  Musculoskeletal: No focal spinal tenderness, no peripheral edema  Neuro: non focal, alert, follows commands, makes simple requests Breasts: right breast at least 2 mass palpated to center of breast, larger mobile axillary mass. No skin or nipple changes. No masses palpated to the left breast. Left axilla benign.    LAB RESULTS:  CMP     Component Value Date/Time   NA 140 01/13/2016 1038   NA 140 09/10/2014 1601   K 3.5 01/13/2016 1038   K 3.7 09/10/2014 1601   CL 101 01/13/2016 1038   CO2 27 01/13/2016 1038   CO2 26 09/10/2014 1601   GLUCOSE 101* 01/13/2016 1038   GLUCOSE 108 09/10/2014 1601   BUN 19 01/13/2016 1038   BUN 18.6 09/10/2014 1601   CREATININE 0.96 01/13/2016 1038   CREATININE 0.8 09/10/2014 1601   CALCIUM 8.9 01/13/2016 1038   CALCIUM 8.8 09/10/2014 1601   PROT 6.7 09/10/2014 1601   ALBUMIN 3.1* 09/10/2014 1601   AST 15 09/10/2014 1601   ALT 6 09/10/2014 1601   ALKPHOS 107 09/10/2014 1601   BILITOT 0.83 09/10/2014 1601   GFRNONAA 51* 01/13/2016 1038   GFRAA 59* 01/13/2016 1038    INo results found for: SPEP, UPEP  Lab Results  Component Value Date   WBC 8.8 01/13/2016   NEUTROABS 5.7 01/13/2016   HGB 11.9* 01/13/2016   HCT 36.7 01/13/2016   MCV 64.2* 01/13/2016   PLT 270 01/13/2016      Chemistry      Component Value Date/Time   NA 140 01/13/2016 1038   NA 140 09/10/2014 1601   K 3.5 01/13/2016 1038   K 3.7 09/10/2014 1601   CL 101 01/13/2016 1038   CO2 27 01/13/2016 1038   CO2 26 09/10/2014 1601   BUN 19 01/13/2016 1038   BUN 18.6 09/10/2014 1601   CREATININE 0.96 01/13/2016 1038   CREATININE 0.8 09/10/2014 1601      Component Value  Date/Time   CALCIUM 8.9 01/13/2016 1038    CALCIUM 8.8 09/10/2014 1601   ALKPHOS 107 09/10/2014 1601   AST 15 09/10/2014 1601   ALT 6 09/10/2014 1601   BILITOT 0.83 09/10/2014 1601       No results found for: LABCA2  No components found for: LABCA125  No results for input(s): INR in the last 168 hours.  Urinalysis No results found for: COLORURINE, APPEARANCEUR, LABSPEC, PHURINE, GLUCOSEU, HGBUR, BILIRUBINUR, KETONESUR, PROTEINUR, UROBILINOGEN, NITRITE, LEUKOCYTESUR  STUDIES: No results found.  ASSESSMENT: 82 y.o. [place] woman with bilateral breast cancer diagnosed with left breast cancer in [place] in 2009 (not clear if biopsy obtained) now with mammography and ultrasonography 09/05/2014 showing  (a) on the Right, clinically multifocal T1c N1, stage IIA breast disease  (b) on the Left, clinically multifocal T2 N1, stage IIB breast disease (T4 N1 or stage IIIB by exam),  (1) biopsy of the 4.6 cm left lower outer quadrant breast mass 09/06/2014 confirmed invasive ductal carcinoma, low-grade,   estrogen and progesterone receptor positive, HER-2 nonamplified, with an MIB-1 of 14%.  (2) anastrozole started November 2015, discontinued April 2017 with progression  (3) tamoxifen started April 2017  PLAN: Ms. Saddler is stable as far as her breast cancer is concerned. She is tolerating the tamoxifen well with no issues. Her last mammogram and ultrasound were in February, so I am working on retrieving these reports from [facility]. We plan to have the imaging repeated on a 6 month basis, so I have placed orders for the next scans to be done in August.   Ms. 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 They have already been told that the remedy would require surgery, and that she would not likely survive the procedure. As for pain, they are going to increase use of aleve to 2-3 times daily. I have also provided her a prescription for  tramadol to use in cases of severe pain.  They are interested in another occupational therapy referral, so I have arranged that.   Ms. Bossler will return in 2 months for follow up with Dr. Jana Hakim after her repeat breast imaging. Her caregivers understand and agree with this plan. They have been encouraged to call with any issues that might arise before her next visit here.    Laurie Panda, NP   03/26/2016 4:04 PM

## 2016-06-18 ENCOUNTER — Ambulatory Visit: Payer: Self-pay | Admitting: Oncology

## 2016-07-03 ENCOUNTER — Other Ambulatory Visit: Payer: Self-pay | Admitting: *Deleted

## 2016-07-03 MED ORDER — TAMOXIFEN CITRATE 20 MG PO TABS: 20.0000 mg | ORAL_TABLET | Freq: Every day | ORAL | 3 refills | Status: AC

## 2016-07-10 ENCOUNTER — Encounter (HOSPITAL_COMMUNITY): Payer: Self-pay

## 2016-07-10 ENCOUNTER — Emergency Department (HOSPITAL_COMMUNITY): Payer: Medicare Other

## 2016-07-10 ENCOUNTER — Inpatient Hospital Stay (HOSPITAL_COMMUNITY)
Admission: EM | Admit: 2016-07-10 | Discharge: 2016-07-14 | DRG: 641 | Disposition: A | Discharge status: Home Health | Payer: Medicare Other | Attending: Internal Medicine | Admitting: Internal Medicine

## 2016-07-10 DIAGNOSIS — W19XXXA Unspecified fall, initial encounter: Secondary | ICD-10-CM | POA: Diagnosis present

## 2016-07-10 DIAGNOSIS — Z66 Do not resuscitate: Secondary | ICD-10-CM | POA: Diagnosis present

## 2016-07-10 DIAGNOSIS — S32049A Unspecified fracture of fourth lumbar vertebra, initial encounter for closed fracture: Secondary | ICD-10-CM | POA: Diagnosis not present

## 2016-07-10 DIAGNOSIS — R197 Diarrhea, unspecified: Secondary | ICD-10-CM | POA: Diagnosis not present

## 2016-07-10 DIAGNOSIS — F32A Depression, unspecified: Secondary | ICD-10-CM | POA: Diagnosis present

## 2016-07-10 DIAGNOSIS — C799 Secondary malignant neoplasm of unspecified site: Secondary | ICD-10-CM | POA: Diagnosis not present

## 2016-07-10 DIAGNOSIS — R109 Unspecified abdominal pain: Secondary | ICD-10-CM | POA: Diagnosis present

## 2016-07-10 DIAGNOSIS — E039 Hypothyroidism, unspecified: Secondary | ICD-10-CM | POA: Diagnosis not present

## 2016-07-10 DIAGNOSIS — C50911 Malignant neoplasm of unspecified site of right female breast: Secondary | ICD-10-CM | POA: Diagnosis not present

## 2016-07-10 DIAGNOSIS — N179 Acute kidney failure, unspecified: Secondary | ICD-10-CM | POA: Diagnosis not present

## 2016-07-10 DIAGNOSIS — F329 Major depressive disorder, single episode, unspecified: Secondary | ICD-10-CM | POA: Diagnosis not present

## 2016-07-10 DIAGNOSIS — Z79810 Long term (current) use of selective estrogen receptor modulators (SERMs): Secondary | ICD-10-CM | POA: Diagnosis not present

## 2016-07-10 DIAGNOSIS — I110 Hypertensive heart disease with heart failure: Secondary | ICD-10-CM | POA: Diagnosis present

## 2016-07-10 DIAGNOSIS — R296 Repeated falls: Secondary | ICD-10-CM | POA: Diagnosis not present

## 2016-07-10 DIAGNOSIS — Z79899 Other long term (current) drug therapy: Secondary | ICD-10-CM | POA: Diagnosis not present

## 2016-07-10 DIAGNOSIS — E860 Dehydration: Secondary | ICD-10-CM | POA: Diagnosis not present

## 2016-07-10 DIAGNOSIS — S32039A Unspecified fracture of third lumbar vertebra, initial encounter for closed fracture: Secondary | ICD-10-CM | POA: Diagnosis not present

## 2016-07-10 DIAGNOSIS — E876 Hypokalemia: Secondary | ICD-10-CM | POA: Diagnosis not present

## 2016-07-10 DIAGNOSIS — Z791 Long term (current) use of non-steroidal anti-inflammatories (NSAID): Secondary | ICD-10-CM | POA: Diagnosis not present

## 2016-07-10 DIAGNOSIS — K5641 Fecal impaction: Secondary | ICD-10-CM | POA: Diagnosis present

## 2016-07-10 DIAGNOSIS — N390 Urinary tract infection, site not specified: Secondary | ICD-10-CM | POA: Diagnosis present

## 2016-07-10 DIAGNOSIS — I5032 Chronic diastolic (congestive) heart failure: Secondary | ICD-10-CM | POA: Diagnosis present

## 2016-07-10 DIAGNOSIS — F0390 Unspecified dementia without behavioral disturbance: Secondary | ICD-10-CM | POA: Diagnosis present

## 2016-07-10 DIAGNOSIS — R262 Difficulty in walking, not elsewhere classified: Secondary | ICD-10-CM

## 2016-07-10 DIAGNOSIS — I10 Essential (primary) hypertension: Secondary | ICD-10-CM | POA: Diagnosis present

## 2016-07-10 LAB — CBC
HEMATOCRIT: 31.5 % — AB (ref 36.0–46.0)
Hemoglobin: 10 g/dL — ABNORMAL LOW (ref 12.0–15.0)
MCH: 21.1 pg — ABNORMAL LOW (ref 26.0–34.0)
MCHC: 31.7 g/dL (ref 30.0–36.0)
MCV: 66.3 fL — AB (ref 78.0–100.0)
PLATELETS: 320 10*3/uL (ref 150–400)
RBC: 4.75 MIL/uL (ref 3.87–5.11)
RDW: 15.6 % — AB (ref 11.5–15.5)
WBC: 12 10*3/uL — AB (ref 4.0–10.5)

## 2016-07-10 LAB — URINALYSIS, ROUTINE W REFLEX MICROSCOPIC
BILIRUBIN URINE: NEGATIVE
GLUCOSE, UA: NEGATIVE mg/dL
HGB URINE DIPSTICK: NEGATIVE
Ketones, ur: NEGATIVE mg/dL
Nitrite: NEGATIVE
PROTEIN: NEGATIVE mg/dL
SPECIFIC GRAVITY, URINE: 1.022 (ref 1.005–1.030)
pH: 6 (ref 5.0–8.0)

## 2016-07-10 LAB — BASIC METABOLIC PANEL
Anion gap: 9 (ref 5–15)
BUN: 42 mg/dL — AB (ref 6–20)
CHLORIDE: 98 mmol/L — AB (ref 101–111)
CO2: 31 mmol/L (ref 22–32)
CREATININE: 1.14 mg/dL — AB (ref 0.44–1.00)
Calcium: 8.8 mg/dL — ABNORMAL LOW (ref 8.9–10.3)
GFR calc Af Amer: 48 mL/min — ABNORMAL LOW (ref >60)
GFR calc non Af Amer: 41 mL/min — ABNORMAL LOW (ref >60)
GLUCOSE: 128 mg/dL — AB (ref 65–99)
POTASSIUM: 3.9 mmol/L (ref 3.5–5.1)
SODIUM: 138 mmol/L (ref 135–145)

## 2016-07-10 LAB — HEPATIC FUNCTION PANEL
ALT: 43 U/L (ref 14–54)
AST: 44 U/L — AB (ref 15–41)
Albumin: 3.3 g/dL — ABNORMAL LOW (ref 3.5–5.0)
Alkaline Phosphatase: 57 U/L (ref 38–126)
BILIRUBIN DIRECT: 0.4 mg/dL (ref 0.1–0.5)
BILIRUBIN INDIRECT: 1.1 mg/dL — AB (ref 0.3–0.9)
BILIRUBIN TOTAL: 1.5 mg/dL — AB (ref 0.3–1.2)
Total Protein: 6.8 g/dL (ref 6.5–8.1)

## 2016-07-10 LAB — URINE MICROSCOPIC-ADD ON

## 2016-07-10 LAB — LIPASE, BLOOD: LIPASE: 32 U/L (ref 11–51)

## 2016-07-10 MED ORDER — SODIUM CHLORIDE 0.9 % IV BOLUS (SEPSIS)
500.0000 mL | Freq: Once | INTRAVENOUS | Status: AC
  Administered 2016-07-10: 500 mL via INTRAVENOUS

## 2016-07-10 MED ORDER — IOPAMIDOL (ISOVUE-300) INJECTION 61%
100.0000 mL | Freq: Once | INTRAVENOUS | Status: AC | PRN
  Administered 2016-07-10: 80 mL via INTRAVENOUS

## 2016-07-10 NOTE — ED Notes (Signed)
Pt transported to CT ?

## 2016-07-10 NOTE — ED Provider Notes (Signed)
[facility] DEPT Provider Note   CSN: [id number] Arrival date & time: 07/10/16  1805     History   Chief Complaint Chief Complaint  Patient presents with  . Flank Pain    R   Level 5 caveat due to dementia. HPI Carrie Cain is a 82 y.o. female.  HPI Patient presents with some right-sided pain. Reportedly has had diarrhea for the last week. Has had some mild falls also. Patient is not merely speak English but does have dementia. Here with her daughter-in-law and her aide. She has right-sided breast cancer that is only being treated with hormonal treatment. Patient is a DO NOT RESUSCITATE. No reported fevers. Patient was seen by PCP today and per family members they were worried of liver or gallbladder.  Past Medical History:  Diagnosis Date  . Anemia   . Cancer of breast ([place])   . Cardiomegaly   . Hearing loss   . Hyperlipidemia   . Hypertension   . Hypothyroid   . Incontinence    bowel bladder  . Macular degeneration   . Osteoporosis     Patient Active Problem List   Diagnosis Date Noted  . Breast cancer, right breast ([place]) 09/10/2014  . Breast cancer of lower-outer quadrant of left female breast ([place]) 09/07/2014  . Dyspnea 08/21/2014  . HTN (hypertension) 08/21/2014  . Secondary pulmonary hypertension ([place]) 08/21/2014  . Chronic diastolic CHF (congestive heart failure) ([place]) 08/21/2014    History reviewed. No pertinent surgical history.  OB History    No data available       Home Medications    Prior to Admission medications   Medication Sig Start Date End Date Taking? Authorizing Provider  acetaminophen-codeine (TYLENOL #3) 300-30 MG tablet Take 1 tablet by mouth every 6 (six) hours as needed for moderate pain or severe pain.   Yes Historical Provider, MD  atenolol (TENORMIN) 50 MG tablet Take 75 mg by mouth daily. 1 1/2 tablets daily 07/26/14  Yes Historical Provider, MD  furosemide (LASIX) 20 MG tablet Take 20 mg by mouth daily. 07/26/14  Yes  Historical Provider, MD  levothyroxine (SYNTHROID, LEVOTHROID) 125 MCG tablet Take 125 mcg by mouth daily before breakfast.   Yes Historical Provider, MD  Multiple Vitamins-Minerals (OCUVITE ADULT 50+) CAPS Take 1 capsule by mouth daily.    Yes Historical Provider, MD  Multiple Vitamins-Minerals (ONE-A-DAY WOMENS 50 PLUS PO) Take 1 tablet by mouth daily.    Yes Historical Provider, MD  Naproxen Sodium (ALEVE PO) Take 125 mg by mouth 3 (three) times daily.    Yes Historical Provider, MD  tamoxifen (NOLVADEX) 20 MG tablet Take 1 tablet (20 mg total) by mouth daily. 07/03/16  Yes Chauncey Cruel, MD  diclofenac sodium (VOLTAREN) 1 % GEL Apply 4 g topically 4 (four) times daily as needed (pain).     Historical Provider, MD  omeprazole (PRILOSEC) 20 MG capsule Take 1 capsule (20 mg total) by mouth at bedtime. Patient not taking: Reported on 07/10/2016 09/10/14   Chauncey Cruel, MD  polyvinyl alcohol (LIQUIFILM TEARS) 1.4 % ophthalmic solution 1 drop as needed for dry eyes (prn post avastin pain).    Historical Provider, MD  QUEtiapine (SEROQUEL) 25 MG tablet Take 0.5-1 tablets (12.5-25 mg total) by mouth 2 (two) times daily as needed. Patient not taking: Reported on 07/10/2016 03/06/16   Penni Bombard, MD  sertraline (ZOLOFT) 25 MG tablet Take 1 tablet (25 mg total) by mouth daily. Patient not taking: Reported on  07/10/2016 03/06/16   Penni Bombard, MD  traMADol (ULTRAM) 50 MG tablet Take 1 tablet (50 mg total) by mouth every 6 (six) hours as needed. Patient taking differently: Take 50 mg by mouth every 6 (six) hours as needed for moderate pain or severe pain.  03/26/16   Laurie Panda, NP  UNABLE TO FIND avastin- ocular injection weekly x 5    Historical Provider, MD    Family History Family History  Problem Relation Age of Onset  . Heart failure Son     deceased  . Cancer - Lung Son     deceased  . Breast cancer Daughter     deceased  . Osteoporosis Daughter   . Hypertension Son    . Hyperlipidemia Son   . Heart attack Father   . Depression Other   . Bipolar disorder Other     Social History Social History  Substance Use Topics  . Smoking status: Never Smoker  . Smokeless tobacco: Not on file  . Alcohol use No     Allergies   Sulfa antibiotics and Remeron [mirtazapine]   Review of Systems Review of Systems  Unable to perform ROS: Dementia  Respiratory: Negative for shortness of breath.   Cardiovascular: Negative for chest pain.     Physical Exam Updated Vital Signs BP 104/67 (BP Location: Left Arm)   Pulse 65   Temp 97.6 F (36.4 C) (Oral)   Resp 18   SpO2 97%   Physical Exam  Constitutional: She appears well-developed.  HENT:  Head: Atraumatic.  Cardiovascular: Normal rate.   Pulmonary/Chest: She exhibits tenderness.  Tenderness to right lateral chest wall. No crepitance or deformity. Patient will not allow me to look up in her axilla where she reportedly has breast cancer.  Abdominal: There is tenderness.  Mild right upper quadrant tenderness. No rebound guarding or ecchymosis.  Neurological: She is alert.  Skin: Skin is warm. Capillary refill takes less than 2 seconds.     ED Treatments / Results  Labs (all labs ordered are listed, but only abnormal results are displayed) Labs Reviewed  URINALYSIS, ROUTINE W REFLEX MICROSCOPIC (NOT AT [facility]) - Abnormal; Notable for the following:       Result Value   Color, Urine AMBER (*)    APPearance CLOUDY (*)    Leukocytes, UA LARGE (*)    All other components within normal limits  BASIC METABOLIC PANEL - Abnormal; Notable for the following:    Chloride 98 (*)    Glucose, Bld 128 (*)    BUN 42 (*)    Creatinine, Ser 1.14 (*)    Calcium 8.8 (*)    GFR calc non Af Amer 41 (*)    GFR calc Af Amer 48 (*)    All other components within normal limits  CBC - Abnormal; Notable for the following:    WBC 12.0 (*)    Hemoglobin 10.0 (*)    HCT 31.5 (*)    MCV 66.3 (*)    MCH 21.1 (*)     RDW 15.6 (*)    All other components within normal limits  HEPATIC FUNCTION PANEL - Abnormal; Notable for the following:    Albumin 3.3 (*)    AST 44 (*)    Total Bilirubin 1.5 (*)    Indirect Bilirubin 1.1 (*)    All other components within normal limits  URINE MICROSCOPIC-ADD ON - Abnormal; Notable for the following:    Squamous Epithelial / LPF 6-30 (*)  Bacteria, UA FEW (*)    All other components within normal limits  LIPASE, BLOOD    EKG  EKG Interpretation None       Radiology Dg Chest 2 View  Result Date: 07/10/2016 CLINICAL DATA:  Pain following fall 1 week prior. Decreased appetite. History of breast carcinoma. Hypertension. EXAM: CHEST  2 VIEW COMPARISON:  August 21, 2014 FINDINGS: The degree of inspiration is shallow. There is mild bibasilar atelectasis. There is no edema or consolidation. Heart is borderline enlarged with pulmonary vascularity within normal limits. There is a large hiatal type hernia. There is atherosclerotic calcification aorta. Bones are diffusely osteoporotic. There is marked anterior wedging of a lower thoracic vertebral body. There is also anterior wedging of a mid thoracic vertebral body which has increased in severity since prior study. There is increase in kyphosis. No adenopathy. IMPRESSION: No edema or consolidation. Stable cardiac silhouette. Aortic atherosclerosis. Sizable hiatal type hernia. Compression fractures in the mid and lower thoracic spine with increase in kyphosis. Bones are diffusely osteoporotic. Electronically Signed   By: Lowella Grip III M.D.   On: 07/10/2016 20:41   Ct Abdomen Pelvis W Contrast  Result Date: 07/11/2016 CLINICAL DATA:  RIGHT flank pain, decreased appetite after fall 1 week ago. History of breast cancer, hypertension, hyperlipidemia. EXAM: CT ABDOMEN AND PELVIS WITH CONTRAST TECHNIQUE: Multidetector CT imaging of the abdomen and pelvis was performed using the standard protocol following bolus administration  of intravenous contrast. CONTRAST:  24mL ISOVUE-300 IOPAMIDOL (ISOVUE-300) INJECTION 61% COMPARISON:  None. FINDINGS: LOWER CHEST: Coarsened pulmonary interstitium without pleural effusion or focal consolidation. Heart size is mildly enlarged. No pericardial effusion. Mild coronary artery calcifications. HEPATOBILIARY: Liver is diffusely hypodense compatible with steatosis. Enlarged intrahepatic inferior vena cava with reflux contrast consistent with RIGHT heart failure. Gallbladder is unremarkable. PANCREAS: Normal. SPLEEN: Multiple irregular clustered cysts measure up to 12 x 18 mm. ADRENALS/URINARY TRACT: Kidneys are orthotopic, demonstrating symmetric enhancement. No nephrolithiasis, hydronephrosis or solid renal masses. The unopacified ureters are normal in course and caliber. Delayed imaging through the kidneys demonstrates symmetric prompt contrast excretion within the proximal urinary collecting system. Urinary bladder is partially distended and unremarkable. Normal adrenal glands. STOMACH/BOWEL: Stool distended rectum to 7.5 cm. Mild colonic diverticulosis. Large diaphragmatic hernia containing the majority of the stomach and, a loop of colon. Moderate amount of retained large bowel stool. VASCULAR/LYMPHATIC: Aortoiliac vessels are normal in course and caliber, severe calcific atherosclerosis. No lymphadenopathy by CT size criteria. REPRODUCTIVE: 2.4 x 3.4 cm homogeneously hypodense RIGHT adnexal cyst. Fluid distended endometrium to 16 mm. OTHER: No intraperitoneal free fluid or free air. MUSCULOSKELETAL: 3.4 x 4 cm solid RIGHT breast mass, adjacent 1.7 cm mass, partially imaged 4.1 x 2.8 cm solid mass extending toward the RIGHT axilla. Osteopenia. Severe Old T12 compression fracture. Mild age indeterminate L3, moderate L4 compression fractures. IMPRESSION: Stool distended rectum concerning for fecal impaction. Moderate amount of retained large bowel stool without bowel obstruction. Multiple thoracolumbar  compression fractures. Given history of fall 1 week ago, the L3 and L4 compression fractures could be acute Multiple solid RIGHT breast masses consistent with breast cancer. Mild cardiomegaly and findings of RIGHT heart failure. **An incidental finding of potential clinical significance has been found. 3.4 cm benign-appearing RIGHT adnexal cyst; recommend follow-up pelvic ultrasound on a nonemergent basis** This recommendation follows ACR consensus guidelines: White Paper of the ACR Incidental Findings Committee II on Adnexal Findings. J Am Coll Radiol [phone]. **An incidental finding of potential clinical significance has been found.  Fluid distended endometrium, recommend close attention on recommended pelvic ultrasound.** **An incidental finding of potential clinical significance has been found. Multiple irregular splenic cystic masses. Though these could represent lymphangiomas, history of cancer metastasis is a concern and would be better characterized on MRI or PET-CT as clinically indicated.** Acute findings discussed with and reconfirmed by Dr.Meighan Treto on 07/10/2016 at 12:06 am. Electronically Signed   By: Elon Alas M.D.   On: 07/11/2016 00:12    Procedures Procedures (including critical care time)  Medications Ordered in ED Medications  sodium chloride 0.9 % bolus 500 mL (0 mLs Intravenous Stopped 07/10/16 2024)  sodium chloride 0.9 % bolus 500 mL (0 mLs Intravenous Stopped 07/10/16 2104)  iopamidol (ISOVUE-300) 61 % injection 100 mL (80 mLs Intravenous Contrast Given 07/10/16 2100)     Initial Impression / Assessment and Plan / ED Course  I have reviewed the triage vital signs and the nursing notes.  Pertinent labs & imaging results that were available during my care of the patient were reviewed by me and considered in my medical decision making (see chart for details).  Clinical Course    Patient with some right-sided pain generalized weakness. Has had some  diarrhea. Appears somewhat dehydrated and creatinine is increased a little bit. Has known metastatic breast cancer that is only on hormonal therapy. Urine does have bacteria but only few bacteria overall somewhat failure to thrive with this. Blood pressures improved with IV fluids. Will admit to internal medicine for IV hydration and further monitoring.  Final Clinical Impressions(s) / ED Diagnoses   Final diagnoses:  Dehydration  Metastatic cancer [facility])    New Prescriptions New Prescriptions   No medications on file     Davonna Belling, MD 07/11/16 [phone]

## 2016-07-10 NOTE — ED Notes (Signed)
Pt pulled out her IV. 

## 2016-07-10 NOTE — ED Triage Notes (Signed)
Pt BIB [facility] from [facility]. Pt fell x1 week ago. Pt has not been seen for fall until today. Pt now complaining of R flank pain and pain under her R breast. Also states that her appetite has decreased. Pt lives at home with her son. Pt does not speak [place]. Hx breast cancer.

## 2016-07-11 ENCOUNTER — Encounter (HOSPITAL_COMMUNITY): Payer: Self-pay | Admitting: *Deleted

## 2016-07-11 ENCOUNTER — Observation Stay (HOSPITAL_COMMUNITY): Payer: Medicare Other

## 2016-07-11 DIAGNOSIS — R1084 Generalized abdominal pain: Secondary | ICD-10-CM

## 2016-07-11 DIAGNOSIS — N390 Urinary tract infection, site not specified: Secondary | ICD-10-CM | POA: Diagnosis not present

## 2016-07-11 DIAGNOSIS — R109 Unspecified abdominal pain: Secondary | ICD-10-CM | POA: Diagnosis present

## 2016-07-11 DIAGNOSIS — C5700 Malignant neoplasm of unspecified fallopian tube: Secondary | ICD-10-CM | POA: Insufficient documentation

## 2016-07-11 DIAGNOSIS — F32A Depression, unspecified: Secondary | ICD-10-CM | POA: Diagnosis present

## 2016-07-11 DIAGNOSIS — E039 Hypothyroidism, unspecified: Secondary | ICD-10-CM | POA: Diagnosis present

## 2016-07-11 DIAGNOSIS — R197 Diarrhea, unspecified: Secondary | ICD-10-CM | POA: Diagnosis present

## 2016-07-11 DIAGNOSIS — E860 Dehydration: Secondary | ICD-10-CM | POA: Diagnosis not present

## 2016-07-11 DIAGNOSIS — N3000 Acute cystitis without hematuria: Secondary | ICD-10-CM

## 2016-07-11 DIAGNOSIS — N179 Acute kidney failure, unspecified: Secondary | ICD-10-CM | POA: Diagnosis not present

## 2016-07-11 DIAGNOSIS — F329 Major depressive disorder, single episode, unspecified: Secondary | ICD-10-CM | POA: Diagnosis present

## 2016-07-11 DIAGNOSIS — I5032 Chronic diastolic (congestive) heart failure: Secondary | ICD-10-CM | POA: Diagnosis not present

## 2016-07-11 LAB — CBC
HCT: 30.4 % — ABNORMAL LOW (ref 36.0–46.0)
HEMOGLOBIN: 9.6 g/dL — AB (ref 12.0–15.0)
MCH: 20.7 pg — ABNORMAL LOW (ref 26.0–34.0)
MCHC: 31.6 g/dL (ref 30.0–36.0)
MCV: 65.5 fL — ABNORMAL LOW (ref 78.0–100.0)
Platelets: 317 10*3/uL (ref 150–400)
RBC: 4.64 MIL/uL (ref 3.87–5.11)
RDW: 15.8 % — ABNORMAL HIGH (ref 11.5–15.5)
WBC: 11.1 10*3/uL — ABNORMAL HIGH (ref 4.0–10.5)

## 2016-07-11 LAB — GASTROINTESTINAL PANEL BY PCR, STOOL (REPLACES STOOL CULTURE)
Adenovirus F40/41: NOT DETECTED
Astrovirus: NOT DETECTED
CRYPTOSPORIDIUM: NOT DETECTED
CYCLOSPORA CAYETANENSIS: NOT DETECTED
Campylobacter species: NOT DETECTED
ENTAMOEBA HISTOLYTICA: NOT DETECTED
ENTEROAGGREGATIVE E COLI (EAEC): DETECTED — AB
Enteropathogenic E coli (EPEC): NOT DETECTED
Enterotoxigenic E coli (ETEC): NOT DETECTED
GIARDIA LAMBLIA: NOT DETECTED
Norovirus GI/GII: NOT DETECTED
Plesimonas shigelloides: NOT DETECTED
ROTAVIRUS A: NOT DETECTED
SALMONELLA SPECIES: NOT DETECTED
SHIGELLA/ENTEROINVASIVE E COLI (EIEC): NOT DETECTED
Sapovirus (I, II, IV, and V): NOT DETECTED
Shiga like toxin producing E coli (STEC): NOT DETECTED
VIBRIO CHOLERAE: NOT DETECTED
Vibrio species: NOT DETECTED
YERSINIA ENTEROCOLITICA: NOT DETECTED

## 2016-07-11 LAB — BRAIN NATRIURETIC PEPTIDE: B Natriuretic Peptide: 317 pg/mL — ABNORMAL HIGH (ref 0.0–100.0)

## 2016-07-11 LAB — BASIC METABOLIC PANEL
ANION GAP: 8 (ref 5–15)
BUN: 32 mg/dL — ABNORMAL HIGH (ref 6–20)
CALCIUM: 8 mg/dL — AB (ref 8.9–10.3)
CO2: 28 mmol/L (ref 22–32)
Chloride: 102 mmol/L (ref 101–111)
Creatinine, Ser: 0.88 mg/dL (ref 0.44–1.00)
GFR, EST NON AFRICAN AMERICAN: 56 mL/min — AB (ref >60)
Glucose, Bld: 109 mg/dL — ABNORMAL HIGH (ref 65–99)
Potassium: 3.5 mmol/L (ref 3.5–5.1)
Sodium: 138 mmol/L (ref 135–145)

## 2016-07-11 LAB — CREATININE, URINE, RANDOM: Creatinine, Urine: 51.97 mg/dL

## 2016-07-11 LAB — LACTIC ACID, PLASMA
LACTIC ACID, VENOUS: 1.8 mmol/L (ref 0.5–1.9)
Lactic Acid, Venous: 1.6 mmol/L (ref 0.5–1.9)

## 2016-07-11 LAB — PROTIME-INR
INR: 1.04
Prothrombin Time: 13.7 seconds (ref 11.4–15.2)

## 2016-07-11 LAB — GLUCOSE, CAPILLARY: GLUCOSE-CAPILLARY: 100 mg/dL — AB (ref 65–99)

## 2016-07-11 LAB — APTT: aPTT: 27 seconds (ref 24–36)

## 2016-07-11 LAB — PROCALCITONIN: Procalcitonin: <0.1 ng/mL

## 2016-07-11 MED ORDER — OXYCODONE HCL 5 MG PO TABS: 5.0000 mg | ORAL_TABLET | ORAL | Status: DC | PRN

## 2016-07-11 MED ORDER — STARCH (THICKENING) PO POWD
ORAL | Status: DC | PRN
  Filled 2016-07-11: qty 227

## 2016-07-11 MED ORDER — LORAZEPAM 2 MG/ML IJ SOLN
0.5000 mg | INTRAMUSCULAR | Status: DC | PRN
  Administered 2016-07-11 – 2016-07-13 (×3): 0.5 mg via INTRAVENOUS
  Filled 2016-07-11 (×3): qty 1

## 2016-07-11 MED ORDER — DEXTROSE 5 % IV SOLN
1.0000 g | Freq: Once | INTRAVENOUS | Status: AC
  Administered 2016-07-11: 1 g via INTRAVENOUS
  Filled 2016-07-11: qty 10

## 2016-07-11 MED ORDER — OCUVITE ADULT 50+ PO CAPS: 1.0000 | ORAL_CAPSULE | Freq: Every day | ORAL | Status: DC

## 2016-07-11 MED ORDER — RESOURCE THICKENUP CLEAR PO POWD
ORAL | Status: DC | PRN
  Filled 2016-07-11: qty 125

## 2016-07-11 MED ORDER — PROSIGHT PO TABS
1.0000 | ORAL_TABLET | Freq: Every day | ORAL | Status: DC
  Administered 2016-07-11 – 2016-07-14 (×3): 1 via ORAL
  Filled 2016-07-11 (×3): qty 1

## 2016-07-11 MED ORDER — LORAZEPAM 2 MG/ML IJ SOLN
0.5000 mg | Freq: Once | INTRAMUSCULAR | Status: AC
Start: 2016-07-11 — End: 2016-07-11
  Administered 2016-07-11: 0.5 mg via INTRAVENOUS
  Filled 2016-07-11: qty 1

## 2016-07-11 MED ORDER — ENOXAPARIN SODIUM 30 MG/0.3ML ~~LOC~~ SOLN
30.0000 mg | SUBCUTANEOUS | Status: DC
  Administered 2016-07-13: 30 mg via SUBCUTANEOUS
  Filled 2016-07-11 (×3): qty 0.3

## 2016-07-11 MED ORDER — ONDANSETRON HCL 4 MG/2ML IJ SOLN: 4.0000 mg | Freq: Four times a day (QID) | INTRAMUSCULAR | Status: DC | PRN

## 2016-07-11 MED ORDER — POLYVINYL ALCOHOL 1.4 % OP SOLN
1.0000 [drp] | OPHTHALMIC | Status: DC | PRN
  Filled 2016-07-11: qty 15

## 2016-07-11 MED ORDER — ATENOLOL 25 MG PO TABS
75.0000 mg | ORAL_TABLET | Freq: Every day | ORAL | Status: DC
  Administered 2016-07-11 – 2016-07-14 (×3): 75 mg via ORAL
  Filled 2016-07-11 (×4): qty 3

## 2016-07-11 MED ORDER — TAMOXIFEN CITRATE 10 MG PO TABS
20.0000 mg | ORAL_TABLET | Freq: Every day | ORAL | Status: DC
Start: 2016-07-11 — End: 2016-07-14
  Administered 2016-07-11 – 2016-07-14 (×4): 20 mg via ORAL
  Filled 2016-07-11 (×5): qty 2

## 2016-07-11 MED ORDER — DEXTROSE 5 % IV SOLN
1.0000 g | INTRAVENOUS | Status: DC
  Administered 2016-07-12: 1 g via INTRAVENOUS
  Filled 2016-07-11: qty 10

## 2016-07-11 MED ORDER — SODIUM CHLORIDE 0.9 % IV SOLN
INTRAVENOUS | Status: AC
  Administered 2016-07-11: 02:00:00 via INTRAVENOUS

## 2016-07-11 MED ORDER — ACETAMINOPHEN 325 MG PO TABS: 650.0000 mg | ORAL_TABLET | Freq: Four times a day (QID) | ORAL | Status: DC | PRN

## 2016-07-11 MED ORDER — ACETAMINOPHEN-CODEINE #3 300-30 MG PO TABS
1.0000 | ORAL_TABLET | Freq: Four times a day (QID) | ORAL | Status: DC | PRN
  Administered 2016-07-11 – 2016-07-12 (×2): 1 via ORAL
  Filled 2016-07-11 (×2): qty 1

## 2016-07-11 MED ORDER — ONDANSETRON HCL 4 MG PO TABS: 4.0000 mg | ORAL_TABLET | Freq: Four times a day (QID) | ORAL | Status: DC | PRN

## 2016-07-11 MED ORDER — SENNOSIDES-DOCUSATE SODIUM 8.6-50 MG PO TABS
1.0000 | ORAL_TABLET | Freq: Two times a day (BID) | ORAL | Status: DC
  Administered 2016-07-12 – 2016-07-14 (×4): 1 via ORAL
  Filled 2016-07-11 (×6): qty 1

## 2016-07-11 MED ORDER — TRAMADOL HCL 50 MG PO TABS: 50.0000 mg | ORAL_TABLET | Freq: Four times a day (QID) | ORAL | Status: DC | PRN

## 2016-07-11 MED ORDER — ACETAMINOPHEN 650 MG RE SUPP: 650.0000 mg | Freq: Four times a day (QID) | RECTAL | Status: DC | PRN

## 2016-07-11 MED ORDER — POLYETHYLENE GLYCOL 3350 17 G PO PACK
17.0000 g | PACK | Freq: Two times a day (BID) | ORAL | Status: DC | PRN
  Filled 2016-07-11: qty 1

## 2016-07-11 MED ORDER — MORPHINE SULFATE (PF) 2 MG/ML IV SOLN
1.0000 mg | INTRAVENOUS | Status: DC | PRN
  Administered 2016-07-11 – 2016-07-14 (×4): 1 mg via INTRAVENOUS
  Filled 2016-07-11 (×4): qty 1

## 2016-07-11 MED ORDER — LEVOTHYROXINE SODIUM 125 MCG PO TABS
125.0000 ug | ORAL_TABLET | Freq: Every day | ORAL | Status: DC
  Administered 2016-07-12 – 2016-07-14 (×3): 125 ug via ORAL
  Filled 2016-07-11 (×4): qty 1

## 2016-07-11 MED ORDER — METRONIDAZOLE IN NACL 5-0.79 MG/ML-% IV SOLN
500.0000 mg | Freq: Three times a day (TID) | INTRAVENOUS | Status: DC
  Administered 2016-07-11 – 2016-07-12 (×5): 500 mg via INTRAVENOUS
  Filled 2016-07-11 (×4): qty 100

## 2016-07-11 MED ORDER — SODIUM CHLORIDE 0.9 % IV SOLN
INTRAVENOUS | Status: AC
  Administered 2016-07-11: 01:00:00 via INTRAVENOUS

## 2016-07-11 MED ORDER — POLYETHYLENE GLYCOL 3350 17 G PO PACK
17.0000 g | PACK | Freq: Two times a day (BID) | ORAL | Status: AC
  Administered 2016-07-12 – 2016-07-13 (×2): 17 g via ORAL
  Filled 2016-07-11 (×3): qty 1

## 2016-07-11 NOTE — Progress Notes (Signed)
CRITICAL VALUE ALERT  Critical value received:  GI PCR enteroaggregative E. Coli EAEC  Date of notification:  07/11/16   Time of notification:  [id number]  Critical value read back:Yes.    Nurse who received alert:  Jacquelin Hawking, RN   MD notified (1st page):  Hollice Gong  Time of first page:  1403  MD notified (2nd page):  Time of second page:  Responding MD:    Time MD responded:

## 2016-07-11 NOTE — H&P (Signed)
History and Physical    Carrie Cain [id number] DOB: 26-Jun-1923 DOA: 07/10/2016  Referring MD/NP/PA:   PCP: Lilian Coma, MD   Patient coming from:  The patient is coming from home.  At baseline, pt is dependent for most of ADL.   Chief Complaint: Diarrhea, increased urinary frequency, fall, chest pain under right breast  HPI: Carrie Cain is a 82 y.o. female with medical history significant of hypertension, hyperlipidemia, hypothyroidism, depression, hearing loss, known breast cancer on tamoxifen (no surgery, radiation or chemotherapy), anemia, macular degeneration, diastolic congestive heart failure, who presents with diarrhea, increased urinary frequency, fall, chest pain under right breast.  Pt does not speak [place]. Per her daughter in law, pt has known hx of breast cancer on tamoxifen (no surgery, radiation or chemotherapy). She has pain under right breast. She fell 1 week ago. Now she has a right flank pain. Per her daughter-in-law, patient has been having diarrhea in the past 3 days. Patient had 5 bowel movements with loose stool today. No nausea or vomiting. Patient has abdominal pain over both sides, difficult to characterize the pain. Patient also has increased urinary frequency, does not seem to have dysuria. Patient moves all extremities. No fever or chills.  ED Course: pt was found to have blood pressure 88/48 which improved to 104/67 after 1 L normal saline bolus, positive urinalysis with large amount of leukocytes, WBC 12.0, lipase 32, lactic acid 1.8, acute renal injury with creatinine 1.14, negative chest x-ray for acute pulmonary issues. Patient is placed on telemetry bed for further evaluation and treatment as inpatient  # CT abdomen/pelvis showed stool distended rectum concerning for fecal impaction. Moderate amount of retained large bowel stool without bowel obstruction. Multiple thoracolumbar compression fractures. Given history of fall 1 week ago, the  L3 and L4 compression fractures could be acute, multiple solid RIGHT breast masses consistent with breast cancer. an incidental finding of 3.4 cm benign-appearing RIGHT adnexal cyst; an incidental finding of fluid distended endometrium, recommend close attention on recommended pelvic ultrasound; an incidental finding of multiple irregular splenic cystic masses. Though these could represent lymphangiomas, history of cancer metastasis is a concern.   Review of Systems:  General: no fevers, chills, no changes in body weight, has poor appetite, has fatigue HEENT: no blurry vision, hearing changes or sore throat Respiratory: no dyspnea, coughing, wheezing CV: no chest pain, no palpitations GI: no nausea, vomiting, has abdominal pain, diarrhea, no constipation GU: no dysuria, burning on urination,  Has increased urinary frequency, no hematuria  Ext: no leg edema Neuro: no unilateral weakness, numbness, or tingling, no vision change or hearing loss Skin: no rash, no skin tear. MSK: No muscle spasm, no deformity, no limitation of range of movement in spin Heme: No easy bruising.  Travel history: No recent long distant travel.  Allergy:  Allergies  Allergen Reactions  . Sulfa Antibiotics Rash  . Remeron [Mirtazapine] Other (See Comments)    Increased dementia issues, symptoms, agitated, combative    Past Medical History:  Diagnosis Date  . Anemia   . Cancer of breast ([place])   . Cardiomegaly   . Hearing loss   . Hyperlipidemia   . Hypertension   . Hypothyroid   . Incontinence    bowel bladder  . Macular degeneration   . Osteoporosis     Past Surgical History:  Procedure Laterality Date  . THYROID SURGERY      Social History:  reports that she has never smoked. She has never used smokeless tobacco.  She reports that she does not drink alcohol or use drugs.  Family History:  Family History  Problem Relation Age of Onset  . Heart attack Father   . Depression Other   . Bipolar  disorder Other   . Heart failure Son     deceased  . Cancer - Lung Son     deceased  . Breast cancer Daughter     deceased  . Osteoporosis Daughter   . Hypertension Son   . Hyperlipidemia Son      Prior to Admission medications   Medication Sig Start Date End Date Taking? Authorizing Provider  acetaminophen-codeine (TYLENOL #3) 300-30 MG tablet Take 1 tablet by mouth every 6 (six) hours as needed for moderate pain or severe pain.   Yes Historical Provider, MD  atenolol (TENORMIN) 50 MG tablet Take 75 mg by mouth daily. 1 1/2 tablets daily 07/26/14  Yes Historical Provider, MD  furosemide (LASIX) 20 MG tablet Take 20 mg by mouth daily. 07/26/14  Yes Historical Provider, MD  levothyroxine (SYNTHROID, LEVOTHROID) 125 MCG tablet Take 125 mcg by mouth daily before breakfast.   Yes Historical Provider, MD  Multiple Vitamins-Minerals (OCUVITE ADULT 50+) CAPS Take 1 capsule by mouth daily.    Yes Historical Provider, MD  Multiple Vitamins-Minerals (ONE-A-DAY WOMENS 50 PLUS PO) Take 1 tablet by mouth daily.    Yes Historical Provider, MD  Naproxen Sodium (ALEVE PO) Take 125 mg by mouth 3 (three) times daily.    Yes Historical Provider, MD  tamoxifen (NOLVADEX) 20 MG tablet Take 1 tablet (20 mg total) by mouth daily. 07/03/16  Yes Chauncey Cruel, MD  diclofenac sodium (VOLTAREN) 1 % GEL Apply 4 g topically 4 (four) times daily as needed (pain).     Historical Provider, MD  omeprazole (PRILOSEC) 20 MG capsule Take 1 capsule (20 mg total) by mouth at bedtime. Patient not taking: Reported on 07/10/2016 09/10/14   Chauncey Cruel, MD  polyvinyl alcohol (LIQUIFILM TEARS) 1.4 % ophthalmic solution 1 drop as needed for dry eyes (prn post avastin pain).    Historical Provider, MD  QUEtiapine (SEROQUEL) 25 MG tablet Take 0.5-1 tablets (12.5-25 mg total) by mouth 2 (two) times daily as needed. Patient not taking: Reported on 07/10/2016 03/06/16   Penni Bombard, MD  sertraline (ZOLOFT) 25 MG tablet Take 1  tablet (25 mg total) by mouth daily. Patient not taking: Reported on 07/10/2016 03/06/16   Penni Bombard, MD  traMADol (ULTRAM) 50 MG tablet Take 1 tablet (50 mg total) by mouth every 6 (six) hours as needed. Patient taking differently: Take 50 mg by mouth every 6 (six) hours as needed for moderate pain or severe pain.  03/26/16   Laurie Panda, NP  UNABLE TO FIND avastin- ocular injection weekly x 5    Historical Provider, MD    Physical Exam: Vitals:   07/10/16 2129 07/11/16 0002 07/11/16 0138 07/11/16 0513  BP: (!) 114/52 104/67 (!) 123/100 (!) 158/62  Pulse: (!) 54 65 86 78  Resp: 16 18 18 18   Temp:   97.8 F (36.6 C)   TempSrc:   Oral   SpO2: 97% 97% 97% 94%   General: Not in acute distress HEENT:       Eyes: PERRL, EOMI, no scleral icterus.       ENT: No discharge from the ears and nose, no pharynx injection, no tonsillar enlargement.        Neck: No JVD, no bruit, no mass  felt. Heme: No neck lymph node enlargement. Cardiac: S1/S2, RRR, No murmurs, No gallops or rubs. Respiratory: No rales, wheezing, rhonchi or rubs. GI: Soft, nondistended, mild tenderness over central abdomen, no rebound pain, no organomegaly, BS present. GU: No hematuria Ext: No pitting leg edema bilaterally. 2+DP/PT pulse bilaterally. Musculoskeletal: No joint deformities, No joint redness or warmth, no limitation of ROM in spin. Skin: No rashes.  Neuro: Alert, oriented X3, cranial nerves II-XII grossly intact, moves all extremities normally.  Psych: Patient is not psychotic, no suicidal or hemocidal ideation.  Labs on Admission: I have personally reviewed following labs and imaging studies  CBC:  Recent Labs Lab 07/10/16 1856 07/11/16 0545  WBC 12.0* 11.1*  HGB 10.0* 9.6*  HCT 31.5* 30.4*  MCV 66.3* 65.5*  PLT 320 [id number]   Basic Metabolic Panel:  Recent Labs Lab 07/10/16 1856  NA 138  K 3.9  CL 98*  CO2 31  GLUCOSE 128*  BUN 42*  CREATININE 1.14*  CALCIUM 8.8*   GFR: CrCl  cannot be calculated (Unknown ideal weight.). Liver Function Tests:  Recent Labs Lab 07/10/16 1856  AST 44*  ALT 43  ALKPHOS 57  BILITOT 1.5*  PROT 6.8  ALBUMIN 3.3*    Recent Labs Lab 07/10/16 1856  LIPASE 32   No results for input(s): AMMONIA in the last 168 hours. Coagulation Profile:  Recent Labs Lab 07/11/16 0302  INR 1.04   Cardiac Enzymes: No results for input(s): CKTOTAL, CKMB, CKMBINDEX, TROPONINI in the last 168 hours. BNP (last 3 results) No results for input(s): PROBNP in the last 8760 hours. HbA1C: No results for input(s): HGBA1C in the last 72 hours. CBG: No results for input(s): GLUCAP in the last 168 hours. Lipid Profile: No results for input(s): CHOL, HDL, LDLCALC, TRIG, CHOLHDL, LDLDIRECT in the last 72 hours. Thyroid Function Tests: No results for input(s): TSH, T4TOTAL, FREET4, T3FREE, THYROIDAB in the last 72 hours. Anemia Panel: No results for input(s): VITAMINB12, FOLATE, FERRITIN, TIBC, IRON, RETICCTPCT in the last 72 hours. Urine analysis:    Component Value Date/Time   COLORURINE AMBER (A) 07/10/2016 2304   APPEARANCEUR CLOUDY (A) 07/10/2016 2304   LABSPEC 1.022 07/10/2016 2304   PHURINE 6.0 07/10/2016 2304   GLUCOSEU NEGATIVE 07/10/2016 2304   HGBUR NEGATIVE 07/10/2016 2304   BILIRUBINUR NEGATIVE 07/10/2016 2304   KETONESUR NEGATIVE 07/10/2016 2304   PROTEINUR NEGATIVE 07/10/2016 2304   NITRITE NEGATIVE 07/10/2016 2304   LEUKOCYTESUR LARGE (A) 07/10/2016 2304   Sepsis Labs: @LABRCNTIP (procalcitonin:4,lacticidven:4) )No results found for this or any previous visit (from the past 240 hour(s)).   Radiological Exams on Admission: Dg Chest 2 View  Result Date: 07/10/2016 CLINICAL DATA:  Pain following fall 1 week prior. Decreased appetite. History of breast carcinoma. Hypertension. EXAM: CHEST  2 VIEW COMPARISON:  August 21, 2014 FINDINGS: The degree of inspiration is shallow. There is mild bibasilar atelectasis. There is no edema  or consolidation. Heart is borderline enlarged with pulmonary vascularity within normal limits. There is a large hiatal type hernia. There is atherosclerotic calcification aorta. Bones are diffusely osteoporotic. There is marked anterior wedging of a lower thoracic vertebral body. There is also anterior wedging of a mid thoracic vertebral body which has increased in severity since prior study. There is increase in kyphosis. No adenopathy. IMPRESSION: No edema or consolidation. Stable cardiac silhouette. Aortic atherosclerosis. Sizable hiatal type hernia. Compression fractures in the mid and lower thoracic spine with increase in kyphosis. Bones are diffusely osteoporotic. Electronically Signed  By: Lowella Grip III M.D.   On: 07/10/2016 20:41   Ct Abdomen Pelvis W Contrast  Result Date: 07/11/2016 CLINICAL DATA:  RIGHT flank pain, decreased appetite after fall 1 week ago. History of breast cancer, hypertension, hyperlipidemia. EXAM: CT ABDOMEN AND PELVIS WITH CONTRAST TECHNIQUE: Multidetector CT imaging of the abdomen and pelvis was performed using the standard protocol following bolus administration of intravenous contrast. CONTRAST:  67mL ISOVUE-300 IOPAMIDOL (ISOVUE-300) INJECTION 61% COMPARISON:  None. FINDINGS: LOWER CHEST: Coarsened pulmonary interstitium without pleural effusion or focal consolidation. Heart size is mildly enlarged. No pericardial effusion. Mild coronary artery calcifications. HEPATOBILIARY: Liver is diffusely hypodense compatible with steatosis. Enlarged intrahepatic inferior vena cava with reflux contrast consistent with RIGHT heart failure. Gallbladder is unremarkable. PANCREAS: Normal. SPLEEN: Multiple irregular clustered cysts measure up to 12 x 18 mm. ADRENALS/URINARY TRACT: Kidneys are orthotopic, demonstrating symmetric enhancement. No nephrolithiasis, hydronephrosis or solid renal masses. The unopacified ureters are normal in course and caliber. Delayed imaging through the  kidneys demonstrates symmetric prompt contrast excretion within the proximal urinary collecting system. Urinary bladder is partially distended and unremarkable. Normal adrenal glands. STOMACH/BOWEL: Stool distended rectum to 7.5 cm. Mild colonic diverticulosis. Large diaphragmatic hernia containing the majority of the stomach and, a loop of colon. Moderate amount of retained large bowel stool. VASCULAR/LYMPHATIC: Aortoiliac vessels are normal in course and caliber, severe calcific atherosclerosis. No lymphadenopathy by CT size criteria. REPRODUCTIVE: 2.4 x 3.4 cm homogeneously hypodense RIGHT adnexal cyst. Fluid distended endometrium to 16 mm. OTHER: No intraperitoneal free fluid or free air. MUSCULOSKELETAL: 3.4 x 4 cm solid RIGHT breast mass, adjacent 1.7 cm mass, partially imaged 4.1 x 2.8 cm solid mass extending toward the RIGHT axilla. Osteopenia. Severe Old T12 compression fracture. Mild age indeterminate L3, moderate L4 compression fractures. IMPRESSION: Stool distended rectum concerning for fecal impaction. Moderate amount of retained large bowel stool without bowel obstruction. Multiple thoracolumbar compression fractures. Given history of fall 1 week ago, the L3 and L4 compression fractures could be acute Multiple solid RIGHT breast masses consistent with breast cancer. Mild cardiomegaly and findings of RIGHT heart failure. **An incidental finding of potential clinical significance has been found. 3.4 cm benign-appearing RIGHT adnexal cyst; recommend follow-up pelvic ultrasound on a nonemergent basis** This recommendation follows ACR consensus guidelines: White Paper of the ACR Incidental Findings Committee II on Adnexal Findings. J Am Coll Radiol [phone]. **An incidental finding of potential clinical significance has been found. Fluid distended endometrium, recommend close attention on recommended pelvic ultrasound.** **An incidental finding of potential clinical significance has been found.  Multiple irregular splenic cystic masses. Though these could represent lymphangiomas, history of cancer metastasis is a concern and would be better characterized on MRI or PET-CT as clinically indicated.** Acute findings discussed with and reconfirmed by Dr.NATHAN PICKERING on 07/10/2016 at 12:06 am. Electronically Signed   By: Elon Alas M.D.   On: 07/11/2016 00:12     EKG:  Not done in ED  Assessment/Plan Principal Problem:   UTI (urinary tract infection) Active Problems:   HTN (hypertension)   Chronic diastolic CHF (congestive heart failure) (H[facility])   Breast cancer, right breast ([facility])   Dehydration   Hypothyroidism   Depression   AKI (acute kidney injury) ([place])   Diarrhea   Abdominal pain   UTI: pt has increased urinary frequency and positive urinalysis, consistent with UTI. Patient does not meet criteria for sepsis. Lactate is normal. Blood pressure was soft, which responded to IV fluid. Likely due to dehydration.  -will  place onTelemetry bed for observation -IV Rocephin -Follow-up blood culture and a urine culture --will get Procalcitonin and trend lactic acid levels per sepsis protocol. -IVF: 500 cc x 2 of NS bolus in ED, followed by 100 cc/h (patient has congestive heart failure, limiting aggressive IV fluids treatment).  Diarrhea and abdominal pain: Patient's daughter-in-law states that patient has diarrhea for 3 days, 5 bowel movements with loose stool today. Not on laxatives. Interestingly, CT abdomen/pelvis showed stool distended rectum concerning for fecal impaction. Moderate amount of retained large bowel stool without bowel obstruction. -IVF as above -check c diff pcr and GI path -When necessary Zofran for nausea, morphine and oxycodone for pain  HTN: -hold lasix due to AKI -Continue atenolol  Chronic diastolic CHF: 2-D echo on 03/22/14 showed EF 65-70%. Patient does not have leg edema or JVD. CHF is compensated. -Hold Lasix due to acute renal injury -Check  BNP -Continue atenolol  Breast cancer, right breast [facility]): Patient is on tamoxifen currently. Followed up by Dr. Jana Hakim. CT-abdomen/pelvis showed possible metastasized disease. -Continue tamoxifen -Follow-up with Dr. Jana Hakim  Hypothyroidism: Last TSH was not on record -Continue home Synthroid  Depression: -Continue home medications: Seroquel, Zoloft  AKI (acute kidney injury) ([place]): Creatinine 1.14, BUN 42, likely due to dehydration. -IV fluid as above -Check FeUrea -Hold Lasix -Follow-up by BMP  Fall and R flank pain: May be due to compression fracture of L3-L4. -pain control as above -f/u Ct-head   DVT ppx: SCD Code Status: Full code Family Communication: Yes, patient's  daughter-in-law  at bed side Disposition Plan:  Anticipate discharge back to previous home environment Consults called:  none Admission status: Obs / tele   Date of Service 07/11/2016    Ivor Costa [facility] Pager [phone]  If 7PM-7AM, please contact night-coverage [email or web address] Password [facility] 07/11/2016, 6:48 AM

## 2016-07-11 NOTE — Progress Notes (Signed)
PT Cancellation Note  Patient Details Name: Carrie Cain MRN: [id number] DOB: 16-Jul-1923   Cancelled Treatment:    Reason Eval/Treat Not Completed: Other (comment) (pt demented, at present agitated, pulling at lines/wires. attempted to phone family to obtain pt's prior level of function but no answer on cell nor home phone. Per RN pt is +2 to get OOB.  will re-attempt with family present.    Blondell Reveal Kistler 07/11/2016, 1:21 PM [phone]

## 2016-07-11 NOTE — Progress Notes (Signed)
Patient not swallowing medications or food/drink with multiple cues and attempts.

## 2016-07-11 NOTE — Progress Notes (Signed)
Lab called to report the patient's [facility] was positive for  Enteroaggregative e. coli. PCT infection control and they advised to put the patient on enteric precautions isolation. Jacquelin Hawking, RN

## 2016-07-11 NOTE — Progress Notes (Signed)
No charge f/u note.  Pt resting comfortably, denies pain or nausea. Wants to eat. Private caregiver at bedside and says she looks much better, and basically back to normal. RN concerned about coughing with PO intake.  - SLP evaluation - continue IV Rocephin and follow cultures - stools are formed so no need to check for Cdiff, no precautions - start PO Miralax for constipation/impaction without obstruction - may need enema if no respose

## 2016-07-11 NOTE — Evaluation (Signed)
Clinical/Bedside Swallow Evaluation Patient Details  Name: Carrie Cain MRN: [id number] Date of Birth: 08/24/1923  Today's Date: 07/11/2016 Time: SLP Start Time (ACUTE ONLY): 64 SLP Stop Time (ACUTE ONLY): 1500 SLP Time Calculation (min) (ACUTE ONLY): 45 min  Past Medical History:  Past Medical History:  Diagnosis Date  . Anemia   . Cancer of breast ([place])   . Cardiomegaly   . Hearing loss   . Hyperlipidemia   . Hypertension   . Hypothyroid   . Incontinence    bowel bladder  . Macular degeneration   . Osteoporosis    Past Surgical History:  Past Surgical History:  Procedure Laterality Date  . THYROID SURGERY     HPI:  Carrie Cain a 82 y.o.femalewith medical history significant of hypertension, hyperlipidemia, hypothyroidism, depression, hearing loss, known breast cancer on tamoxifen (no surgery, radiation or chemotherapy), anemia, macular degeneration, diastolic congestive heart failure, who presents with diarrhea, increased urinary frequency, fall, chest pain under right breast.   Pt does not speak [place]. Per her daughter in law, pt has known hx of breast cancer on tamoxifen (no surgery, radiation or chemotherapy). She has pain under right breast. She fell 1 week ago. Now she has a right flank pain. Per her daughter-in-law, patient has been having diarrhea in the past 3 days. Patient had 5 bowel movements with loose stool today. No nausea or vomiting. Patient has abdominal pain over both sides, difficult to characterize the pain. Patient also has increased urinary frequency, does not seem to have dysuria. Patient moves all extremities. No fever or chills.  Per the patient's daughter in law, Carrie Cain, the patient has been coughing with liquid intake for about a week.     Assessment / Plan / Recommendation Clinical Impression  Limited clinical swallowing evaluation was completed using thin liquids via tsp sips, nectar thick liquids via cup sips and very small amount  of pureed material due to the patient's reluctance for PO intake.  Oral mechanism exam was attempted and the patient only perform lingual protrusion and lateralization.  Had a discussion with the patient's daughter in law on the telephone and she stated that the patient has been coughing on liquid intake for about a week.  She also reported that the patient generally eats [place] food.  Today the patient presented with oral and pharyngeal dysphagia characterized by decreased labial seal with anterior escape and delayed swallow trigger.   Immediate cough was noted post swallow following thin liquids via spoon sips.  S/S of aspiration were not seen post swallow given nectar thick liquids. The patient only took about 1/5 tsp bolus of purees.  Given this unable to recommend diet advancement from clears.  Recommend that the patient continue with NECTAR thick clear liquid diet.  ST will follow up for possible advancement of diet and need for possible MBS to fully assess pharyngeal function.      Aspiration Risk  Moderate aspiration risk    Diet Recommendation  NECTAR thick CLEAR liquid diet.     Medication Administration: Crushed with nectar liquids    Other  Recommendations Oral Care Recommendations: Oral care BID Other Recommendations: Order thickener from pharmacy   Follow up Recommendations  (TBD)      Frequency and Duration min 2x/week  2 weeks       Prognosis Prognosis for Safe Diet Advancement: Good      Swallow Study   General Date of Onset: 07/10/16 HPI: Carrie Massandis a 82 y.o.femalewith medical history significant of hypertension,  hyperlipidemia, hypothyroidism, depression, hearing loss, known breast cancer on tamoxifen (no surgery, radiation or chemotherapy), anemia, macular degeneration, diastolic congestive heart failure, who presents with diarrhea, increased urinary frequency, fall, chest pain under right breast.   Pt does not speak [place]. Per her daughter in law, pt has  known hx of breast cancer on tamoxifen (no surgery, radiation or chemotherapy). She has pain under right breast. She fell 1 week ago. Now she has a right flank pain. Per her daughter-in-law, patient has been having diarrhea in the past 3 days. Patient had 5 bowel movements with loose stool today. No nausea or vomiting. Patient has abdominal pain over both sides, difficult to characterize the pain. Patient also has increased urinary frequency, does not seem to have dysuria. Patient moves all extremities. No fever or chills.  Per the patient's daughter in law, Carrie Cain, the patient has been coughing with liquid intake for about a week.   Type of Study: Bedside Swallow Evaluation Previous Swallow Assessment: None noted.   Diet Prior to this Study: Other (Comment) (Clears) Temperature Spikes Noted: No Respiratory Status: Room air History of Recent Intubation: No Behavior/Cognition: Alert;Requires cueing;Doesn't follow directions;Confused Oral Cavity Assessment: Within Functional Limits Oral Care Completed by SLP: No Oral Cavity - Dentition: Poor condition Vision: Functional for self-feeding Self-Feeding Abilities: Needs assist Patient Positioning: Upright in bed Baseline Vocal Quality: Low vocal intensity Volitional Cough: Cognitively unable to elicit Volitional Swallow: Unable to elicit    Oral/Motor/Sensory Function Overall Oral Motor/Sensory Function: Other (comment) (Unable to assess.)   Ice Chips Ice chips: Not tested   Thin Liquid Thin Liquid: Impaired Presentation: Spoon Oral Phase Impairments: Reduced labial seal Oral Phase Functional Implications: Left anterior spillage;Right anterior spillage Pharyngeal  Phase Impairments: Suspected delayed Swallow;Cough - Immediate    Nectar Thick Nectar Thick Liquid: Impaired Presentation: Spoon;Cup;Self Fed Oral Phase Impairments: Reduced labial seal Oral phase functional implications: Right anterior spillage;Left anterior spillage Pharyngeal  Phase Impairments: Suspected delayed Swallow   Honey Thick Honey Thick Liquid: Not tested   Puree Puree: Impaired Presentation: Spoon;Self Fed Pharyngeal Phase Impairments: Suspected delayed Swallow   Solid   GO   Solid: Not tested       Shelly Flatten, MA, CCC-SLP Acute Rehab SLP [phone]  Lamar Sprinkles 07/11/2016,3:34 PM

## 2016-07-12 DIAGNOSIS — E860 Dehydration: Secondary | ICD-10-CM | POA: Diagnosis not present

## 2016-07-12 DIAGNOSIS — N179 Acute kidney failure, unspecified: Secondary | ICD-10-CM

## 2016-07-12 DIAGNOSIS — N390 Urinary tract infection, site not specified: Secondary | ICD-10-CM | POA: Diagnosis not present

## 2016-07-12 LAB — COMPREHENSIVE METABOLIC PANEL
ALT: 38 U/L (ref 14–54)
ANION GAP: 7 (ref 5–15)
AST: 49 U/L — ABNORMAL HIGH (ref 15–41)
Albumin: 3 g/dL — ABNORMAL LOW (ref 3.5–5.0)
Alkaline Phosphatase: 52 U/L (ref 38–126)
BUN: 20 mg/dL (ref 6–20)
CALCIUM: 7.8 mg/dL — AB (ref 8.9–10.3)
CHLORIDE: 103 mmol/L (ref 101–111)
CO2: 26 mmol/L (ref 22–32)
Creatinine, Ser: 0.76 mg/dL (ref 0.44–1.00)
GFR calc non Af Amer: >60 mL/min (ref >60)
Glucose, Bld: 155 mg/dL — ABNORMAL HIGH (ref 65–99)
Potassium: 4 mmol/L (ref 3.5–5.1)
SODIUM: 136 mmol/L (ref 135–145)
Total Bilirubin: 1.1 mg/dL (ref 0.3–1.2)
Total Protein: 6.3 g/dL — ABNORMAL LOW (ref 6.5–8.1)

## 2016-07-12 LAB — CBC
HCT: 29.6 % — ABNORMAL LOW (ref 36.0–46.0)
Hemoglobin: 9.5 g/dL — ABNORMAL LOW (ref 12.0–15.0)
MCH: 20.9 pg — ABNORMAL LOW (ref 26.0–34.0)
MCHC: 32.1 g/dL (ref 30.0–36.0)
MCV: 65.2 fL — ABNORMAL LOW (ref 78.0–100.0)
PLATELETS: 311 10*3/uL (ref 150–400)
RBC: 4.54 MIL/uL (ref 3.87–5.11)
RDW: 15.8 % — ABNORMAL HIGH (ref 11.5–15.5)
WBC: 9 10*3/uL (ref 4.0–10.5)

## 2016-07-12 LAB — BASIC METABOLIC PANEL
Anion gap: 8 (ref 5–15)
BUN: 17 mg/dL (ref 6–20)
CALCIUM: 4.9 mg/dL — AB (ref 8.9–10.3)
CO2: 18 mmol/L — ABNORMAL LOW (ref 22–32)
CREATININE: 0.83 mg/dL (ref 0.44–1.00)
Chloride: 114 mmol/L — ABNORMAL HIGH (ref 101–111)
Glucose, Bld: 63 mg/dL — ABNORMAL LOW (ref 65–99)
Potassium: 2.6 mmol/L — CL (ref 3.5–5.1)
SODIUM: 140 mmol/L (ref 135–145)

## 2016-07-12 LAB — URINE CULTURE

## 2016-07-12 LAB — UREA NITROGEN, URINE: Urea Nitrogen, Ur: 642 mg/dL

## 2016-07-12 LAB — GLUCOSE, CAPILLARY: GLUCOSE-CAPILLARY: 84 mg/dL (ref 65–99)

## 2016-07-12 LAB — MAGNESIUM: MAGNESIUM: 2 mg/dL (ref 1.7–2.4)

## 2016-07-12 MED ORDER — CIPROFLOXACIN IN D5W 400 MG/200ML IV SOLN
400.0000 mg | Freq: Two times a day (BID) | INTRAVENOUS | Status: DC
  Administered 2016-07-12 – 2016-07-13 (×4): 400 mg via INTRAVENOUS
  Filled 2016-07-12 (×4): qty 200

## 2016-07-12 MED ORDER — POTASSIUM CHLORIDE 10 MEQ/100ML IV SOLN
10.0000 meq | INTRAVENOUS | Status: AC
  Administered 2016-07-12 (×4): 10 meq via INTRAVENOUS
  Filled 2016-07-12 (×4): qty 100

## 2016-07-12 MED ORDER — SODIUM CHLORIDE 0.9 % IV SOLN: 1.0000 g | Freq: Once | INTRAVENOUS | Status: DC

## 2016-07-12 MED ORDER — CALCIUM CARBONATE ANTACID 500 MG PO CHEW
1.0000 | CHEWABLE_TABLET | Freq: Once | ORAL | Status: DC
  Filled 2016-07-12: qty 1

## 2016-07-12 MED ORDER — CALCIUM GLUCONATE 10 % IV SOLN
2.0000 g | Freq: Once | INTRAVENOUS | Status: AC
  Administered 2016-07-12: 2 g via INTRAVENOUS
  Filled 2016-07-12: qty 20

## 2016-07-12 NOTE — Progress Notes (Signed)
CRITICAL VALUE ALERT  Critical value received: K 2.6   Ca 4.9  Date of notification:  07/12/16  Time of notification:  0630  Critical value read back: yes  Nurse who received alert:  Magda Paganini  MD notified (1st page):  Hillary Bow  Time of first page:  [phone]  Responding MD:  Wendee Beavers  Time MD responded:  [phone]

## 2016-07-12 NOTE — Progress Notes (Signed)
PROGRESS NOTE  Carrie Cain [id number] DOB: May 27, 1923 DOA: 07/10/2016 PCP: Lilian Coma, MD  Hospital Course/Subjective: 82 y.o. female with medical history significant of hypertension, hyperlipidemia, hypothyroidism, depression, hearing loss, known breast cancer on tamoxifen (no surgery, radiation or chemotherapy), anemia, macular degeneration, diastolic congestive heart failure, who presents with diarrhea, increased urinary frequency, fall, chest pain under right breast. She was found to have stool impaction, possible UTI and Ecoli in her stool for which she is being treated.   Daughter in law Santiago Glad is at the bedside this AM, discussed with RN as well. Patient has been agitated, requiring IV ativan and had mitts on as well. Patient resting comfortably this AM, is having small volume formed stools. She took some Miralax yesterday morning, but refused last night.   Assessment/Plan: UTI: pt has increased urinary frequency and positive urinalysis, consistent with UTI. Patient does not meet criteria for sepsis. Lactate is normal. Blood pressure was soft, which responded to IV fluid. Likely due to dehydration. -IV Rocephin has now been changed as noted below as Urine Culture has not yet resulted -Follow-up blood culture and a urine culture  Diarrhea and abdominal pain: Patient's daughter-in-law states that patient has diarrhea for 3 days, 5 bowel movements with loose stool. Not on laxatives. Interestingly, CT abdomen/pelvis showed stool distended rectum concerning for fecal impaction. Moderate amount of retained large bowel stool without bowel obstruction. -IVF as above - GI panel PCR positive for Ecoli - changed abx to Cipro/Flagyl today -When necessary Zofran for nausea, morphine and oxycodone for pain - PO Miralax without effect, will give soap suds enema today - I think resolving her impaction will help her agitation/discomfort  HTN: -hold lasix due to AKI -Continue  atenolol  Chronic diastolic CHF: 2-D echo on 03/22/14 showed EF 65-70%. Patient does not have leg edema or JVD. CHF is compensated. -Hold Lasix due to acute renal injury -Check BNP -Continue atenolol  Breast cancer, right breast [facility]): Patient is on tamoxifen currently. Followed up by Dr. Jana Hakim. CT-abdomen/pelvis showed possible metastasized disease. -Continue tamoxifen -Follow-up with Dr. Jana Hakim, discussed imaging findings at length with Santiago Glad, recommend further imaging including pelvic US and maybe PET-CT per Dr. Jana Hakim  Hypothyroidism: Last TSH was not on record -Continue home Synthroid  Depression: -Continue home medications: Seroquel, Zoloft  AKI (acute kidney injury) [facility]): Creatinine 1.14, BUN 42, likely due to dehydration. -IV fluid as above -Follow-up by BMP daily  Fall and R flank pain: May be due to compression fracture of L3-L4. -pain control as above  Hypokalemia - likely due to loose stool - replete IV today, recheck in AM  DVT ppx: SCD Code Status: Full code  Family Communication: Yes, patient's  daughter-in-law  at bed side  Disposition Plan:  Anticipate discharge to SNF, she may need higher level of care going forward as well  Consults called:  none  Admission status: Inpatient  Antimicrobials:  Rocephin 9/22-9/24  Flagyl 9/23  Cipro 9/24    Objective: Vitals:   07/11/16 1430 07/11/16 2025 07/11/16 2118 07/12/16 0544  BP: 135/88  97/76 113/82  Pulse: (!) 108  (!) 52 (!) 53  Resp: 18  19 18   Temp: 97.9 F (36.6 C)  97.9 F (36.6 C) 97.7 F (36.5 C)  TempSrc: Oral  Oral Oral  SpO2: 96%  99% 94%  Weight:  50.2 kg (110 lb 11.2 oz)    Height:  4\' 10"  (1.473 m)      Intake/Output Summary (Last 24 hours) at 07/12/16 [id number]  Last data filed at 07/12/16 [id number]  Gross per 24 hour  Intake              555 ml  Output                0 ml  Net              555 ml   Filed Weights   07/11/16 2025  Weight: 50.2 kg (110 lb 11.2 oz)      Exam: General:  Thin elderly female, Hindi speaking. Restless Neck: supple, no masses, trachea mildline  Cardiovascular: RRR, no murmurs or rubs, no peripheral edema  Respiratory: clear to auscultation bilaterally, no wheezes, no crackles  Abdomen: soft, nontender, nondistended Skin: dry, no rashes  Musculoskeletal: no joint effusions, normal range of motion  Neurologic: extraocular muscles intact, clear speech, moving all extremities with intact sensorium   Data Reviewed: CBC:  Recent Labs Lab 07/10/16 1856 07/11/16 0545 07/12/16 0542  WBC 12.0* 11.1* 9.0  HGB 10.0* 9.6* 9.5*  HCT 31.5* 30.4* 29.6*  MCV 66.3* 65.5* 65.2*  PLT 320 317 [id number]   Basic Metabolic Panel:  Recent Labs Lab 07/10/16 1856 07/11/16 0545 07/12/16 0542  NA 138 138 140  K 3.9 3.5 2.6*  CL 98* 102 114*  CO2 31 28 18*  GLUCOSE 128* 109* 63*  BUN 42* 32* 17  CREATININE 1.14* 0.88 0.83  CALCIUM 8.8* 8.0* 4.9*   GFR: Estimated Creatinine Clearance: 31.7 mL/min (by C-G formula based on SCr of 0.83 mg/dL). Liver Function Tests:  Recent Labs Lab 07/10/16 1856  AST 44*  ALT 43  ALKPHOS 57  BILITOT 1.5*  PROT 6.8  ALBUMIN 3.3*    Recent Labs Lab 07/10/16 1856  LIPASE 32   No results for input(s): AMMONIA in the last 168 hours. Coagulation Profile:  Recent Labs Lab 07/11/16 0302  INR 1.04   Cardiac Enzymes: No results for input(s): CKTOTAL, CKMB, CKMBINDEX, TROPONINI in the last 168 hours. BNP (last 3 results) No results for input(s): PROBNP in the last 8760 hours. HbA1C: No results for input(s): HGBA1C in the last 72 hours. CBG:  Recent Labs Lab 07/11/16 0804 07/12/16 0757  GLUCAP 100* 84   Lipid Profile: No results for input(s): CHOL, HDL, LDLCALC, TRIG, CHOLHDL, LDLDIRECT in the last 72 hours. Thyroid Function Tests: No results for input(s): TSH, T4TOTAL, FREET4, T3FREE, THYROIDAB in the last 72 hours. Anemia Panel: No results for input(s): VITAMINB12, FOLATE,  FERRITIN, TIBC, IRON, RETICCTPCT in the last 72 hours. Urine analysis:    Component Value Date/Time   COLORURINE AMBER (A) 07/10/2016 2304   APPEARANCEUR CLOUDY (A) 07/10/2016 2304   LABSPEC 1.022 07/10/2016 2304   PHURINE 6.0 07/10/2016 2304   GLUCOSEU NEGATIVE 07/10/2016 2304   HGBUR NEGATIVE 07/10/2016 2304   BILIRUBINUR NEGATIVE 07/10/2016 2304   KETONESUR NEGATIVE 07/10/2016 2304   PROTEINUR NEGATIVE 07/10/2016 2304   NITRITE NEGATIVE 07/10/2016 2304   LEUKOCYTESUR LARGE (A) 07/10/2016 2304   Sepsis Labs: @LABRCNTIP (procalcitonin:4,lacticidven:4)  ) Recent Results (from the past 240 hour(s))  Gastrointestinal Panel by PCR , Stool     Status: Abnormal   Collection Time: 07/11/16  2:02 AM  Result Value Ref Range Status   Campylobacter species NOT DETECTED NOT DETECTED Final   Plesimonas shigelloides NOT DETECTED NOT DETECTED Final   Salmonella species NOT DETECTED NOT DETECTED Final   Yersinia enterocolitica NOT DETECTED NOT DETECTED Final   Vibrio species NOT DETECTED NOT DETECTED Final   Vibrio cholerae  NOT DETECTED NOT DETECTED Final   Enteroaggregative E coli (EAEC) DETECTED (A) NOT DETECTED Final    Comment: CRITICAL RESULT CALLED TO, READ BACK BY AND VERIFIED WITH: BILLY HOOKER 07/11/16 1250 SGD CRITICAL RESULT CALLED TO, READ BACK BY AND VERIFIED WITH: BROWN,E AT 1330 ON [id number] BY HOOKER,B    Enteropathogenic E coli (EPEC) NOT DETECTED NOT DETECTED Final   Enterotoxigenic E coli (ETEC) NOT DETECTED NOT DETECTED Final   Shiga like toxin producing E coli (STEC) NOT DETECTED NOT DETECTED Final   Shigella/Enteroinvasive E coli (EIEC) NOT DETECTED NOT DETECTED Final   Cryptosporidium NOT DETECTED NOT DETECTED Final   Cyclospora cayetanensis NOT DETECTED NOT DETECTED Final   Entamoeba histolytica NOT DETECTED NOT DETECTED Final   Giardia lamblia NOT DETECTED NOT DETECTED Final   Adenovirus F40/41 NOT DETECTED NOT DETECTED Final   Astrovirus NOT DETECTED NOT DETECTED  Final   Norovirus GI/GII NOT DETECTED NOT DETECTED Final   Rotavirus A NOT DETECTED NOT DETECTED Final   Sapovirus (I, II, IV, and V) NOT DETECTED NOT DETECTED Final     Studies: No results found.  Scheduled Meds: . atenolol  75 mg Oral Daily  . calcium carbonate  1 tablet Oral Once  . ciprofloxacin  400 mg Intravenous Q12H  . enoxaparin (LOVENOX) injection  30 mg Subcutaneous Q24H  . levothyroxine  125 mcg Oral QAC breakfast  . metronidazole  500 mg Intravenous Q8H  . multivitamin  1 tablet Oral Daily  . polyethylene glycol  17 g Oral BID  . potassium chloride  10 mEq Intravenous Q1 Hr x 4  . senna-docusate  1 tablet Oral BID  . tamoxifen  20 mg Oral Daily    Continuous Infusions:    LOS: 1 day   Time spent: 21 minutes  Mir Marry Guan, MD [facility] Pager [phone]  If 7PM-7AM, please contact night-coverage [email or web address] Password [facility] 07/12/2016, 9:38 AM

## 2016-07-13 DIAGNOSIS — I5032 Chronic diastolic (congestive) heart failure: Secondary | ICD-10-CM | POA: Diagnosis not present

## 2016-07-13 DIAGNOSIS — N390 Urinary tract infection, site not specified: Secondary | ICD-10-CM | POA: Diagnosis not present

## 2016-07-13 DIAGNOSIS — E860 Dehydration: Secondary | ICD-10-CM | POA: Diagnosis not present

## 2016-07-13 LAB — CBC
HEMATOCRIT: 30 % — AB (ref 36.0–46.0)
Hemoglobin: 9.7 g/dL — ABNORMAL LOW (ref 12.0–15.0)
MCH: 21.2 pg — AB (ref 26.0–34.0)
MCHC: 32.3 g/dL (ref 30.0–36.0)
MCV: 65.6 fL — AB (ref 78.0–100.0)
PLATELETS: 313 10*3/uL (ref 150–400)
RBC: 4.57 MIL/uL (ref 3.87–5.11)
RDW: 15.6 % — ABNORMAL HIGH (ref 11.5–15.5)
WBC: 10.6 10*3/uL — AB (ref 4.0–10.5)

## 2016-07-13 LAB — BASIC METABOLIC PANEL
Anion gap: 9 (ref 5–15)
BUN: 14 mg/dL (ref 6–20)
CHLORIDE: 105 mmol/L (ref 101–111)
CO2: 23 mmol/L (ref 22–32)
CREATININE: 0.77 mg/dL (ref 0.44–1.00)
Calcium: 8.4 mg/dL — ABNORMAL LOW (ref 8.9–10.3)
GFR calc non Af Amer: >60 mL/min (ref >60)
Glucose, Bld: 93 mg/dL (ref 65–99)
POTASSIUM: 3.6 mmol/L (ref 3.5–5.1)
Sodium: 137 mmol/L (ref 135–145)

## 2016-07-13 LAB — GLUCOSE, CAPILLARY: Glucose-Capillary: 94 mg/dL (ref 65–99)

## 2016-07-13 NOTE — Evaluation (Addendum)
Physical Therapy Evaluation Patient Details Name: Carrie Cain MRN: [id number] DOB: April 11, 1923 Today's Date: 07/13/2016   History of Present Illness  82 yo female admitted with UTI, diarrhea, fall. Hx of HTN, breast cancer, macular degeneration, anemia, CHF, comp fx L3-L4  Clinical Impression  On eval, pt required Min assist for partial supine/sidelying> sit. Pt would not participate beyond that. She repeatedly asked for therapist to remove mittens- "open...open". No family present during session. Recommend SNF vs HHPT depending on family's ability to provide care. If family cannot provide care, pt will need SNF    Follow Up Recommendations SNF vs Home health PT;Supervision/Assistance - 24 hour (depending on family's ability to provide current level of care)    Equipment Recommendations       Recommendations for Other Services       Precautions / Restrictions Precautions Precautions: Fall Restrictions Weight Bearing Restrictions: No      Mobility  Bed Mobility Overal bed mobility: Needs Assistance Bed Mobility: Supine to Sit;Sit to Supine     Supine to sit: Min assist Sit to supine: Min assist   General bed mobility comments: Assist for trunk and LEs. Pt sat up ~3/4 of the way then returned to sidelying/supine. She would not participate any further  Transfers                 General transfer comment: NT  Ambulation/Gait                Stairs            Wheelchair Mobility    Modified Rankin (Stroke Patients Only)       Balance                                             Pertinent Vitals/Pain Pain Assessment: Faces Faces Pain Scale: No hurt    Home Living Family/patient expects to be discharged to:: Unsure                 Additional Comments: pt unable to provide PLOF and no family available at time of eval    Prior Function           Comments: pt unable to provide PLOF and no family available at  time of eval     Hand Dominance        Extremity/Trunk Assessment   Upper Extremity Assessment: Difficult to assess due to impaired cognition           Lower Extremity Assessment: Difficult to assess due to impaired cognition      Cervical / Trunk Assessment: Kyphotic  Communication   Communication: Prefers language other than English  Cognition Arousal/Alertness: Awake/alert Behavior During Therapy: WFL for tasks assessed/performed Overall Cognitive Status: Difficult to assess                      General Comments      Exercises     Assessment/Plan    PT Assessment Patient needs continued PT services  PT Problem List Decreased strength;Decreased mobility;Decreased cognition;Decreased activity tolerance;Decreased balance;Decreased knowledge of use of DME          PT Treatment Interventions DME instruction;Gait training;Functional mobility training;Therapeutic activities;Therapeutic exercise;Balance training;Patient/family education    PT Goals (Current goals can be found in the Care Plan section)  Acute Rehab PT Goals Patient Stated Goal: none stated  PT Goal Formulation: Patient unable to participate in goal setting Time For Goal Achievement: 07/27/16 Potential to Achieve Goals: Fair    Frequency Min 3X/week   Barriers to discharge        Co-evaluation               End of Session   Activity Tolerance: Patient tolerated treatment well Patient left: in bed;with call bell/phone within reach;with bed alarm set           Time: [id number] PT Time Calculation (min) (ACUTE ONLY): 8 min   Charges:   PT Evaluation $PT Eval Low Complexity: 1 Procedure     PT G Codes:        Weston Anna, MPT Pager: [phone]

## 2016-07-13 NOTE — Progress Notes (Signed)
SLP Cancellation Note  Patient Details Name: Carrie Cain MRN: [id number] DOB: 22-Jun-1923   Cancelled treatment:       Reason Eval/Treat Not Completed: Fatigue/lethargy limiting ability to participate (rn reports pt tolerating minimal po accepting, largely only eating for family, pt alone currently resting in bed, will continue efforts)   Luanna Salk, [facility] SLP [phone]   07/13/2016, 3:29 PM

## 2016-07-13 NOTE — Progress Notes (Signed)
PROGRESS NOTE  Carrie Cain [id number] DOB: [birth year] DOA: 07/10/2016 PCP: Lilian Coma, MD  Hospital Course/Subjective: 82 y.o. female with medical history significant of hypertension, hyperlipidemia, hypothyroidism, depression, hearing loss, known breast cancer on tamoxifen (no surgery, radiation or chemotherapy), anemia, macular degeneration, diastolic congestive heart failure, who presents with diarrhea, increased urinary frequency, fall, chest pain under right breast. She was found to have stool impaction, possible UTI and Ecoli in her stool for which she is being treated.   No acute events overnight, had enema yesterday, this AM she is more calm. Denies pain or nausea. Santiago Glad not at bedside this AM, but spoke with private caregiver in the room this AM.   Assessment/Plan: UTI: pt has increased urinary frequency and positive urinalysis, consistent with UTI, but UCx contaminated. - initially on IV Rocephin has been d/c -Follow-up blood culture and a urine culture  Diarrhea and abdominal pain: Patient's daughter-in-law states that patient has diarrhea for 3 days, 5 bowel movements with loose stool. Not on laxatives. Interestingly, CT abdomen/pelvis showed stool distended rectum concerning for fecal impaction. Moderate amount of retained large bowel stool without bowel obstruction. -IVF as above - GI panel PCR positive for Ecoli - changed abx to Cipro/Flagyl 9/25 and will continue IV Cipro only for now total 10 days. -When necessary Zofran for nausea, morphine and oxycodone for pain - PO Miralax without effect as she refused PO, given soap suds enema 9/24 and abd soft today - I think resolving her impaction will help her agitation/discomfort  HTN: -hold lasix due to AKI -Continue atenolol  Chronic diastolic CHF: 2-D echo on 03/22/14 showed EF 65-70%. Patient does not have leg edema or JVD. CHF is compensated. -Hold Lasix due to acute renal injury which is resolved  now -Continue atenolol  Breast cancer, right breast [facility]): Patient is on tamoxifen currently. Followed up by Dr. Jana Hakim. CT-abdomen/pelvis showed possible metastasized disease. -Continue tamoxifen -she will follow-up with Dr. Jana Hakim, discussed imaging findings at length with Santiago Glad, recommend further imaging including pelvic US and maybe PET-CT per Dr. Jana Hakim  Hypothyroidism: Last TSH was not on record -Continue home Synthroid  Depression: -Continue home medications: Seroquel, Zoloft  AKI (acute kidney injury) [facility]): on admission Creatinine 1.14, BUN 42, likely due to dehydration. -IV fluid as above -renal function now back to normal  Fall and R flank pain: May be due to compression fracture of L3-L4. -pain control as above  Hypokalemia - likely due to loose stool - repleted and normal, follow daily  DVT ppx: SCD  Code Status: Full code  Family Communication: Daughter in law not in room. Will update later today.  Disposition Plan:  Anticipate discharge to SNF, she may need higher level of care going forward as well.  Consults called:  none  Admission status: Inpatient  Antimicrobials:  Rocephin 9/22-9/24  Flagyl 9/23 - 9/25  Cipro 9/24 --> plan to end Monday 10/2.   Objective: Vitals:   07/12/16 1100 07/12/16 1411 07/12/16 2131 07/13/16 0557  BP: (!) 108/56 111/60 (!) 134/53 (!) 151/80  Pulse: 62 70 (!) 53 (!) 59  Resp:  18 18 18   Temp: 97.9 F (36.6 C) 97.7 F (36.5 C) 98.4 F (36.9 C) 97.8 F (36.6 C)  TempSrc: Axillary Axillary Axillary Axillary  SpO2: 95% 97% 93% 98%  Weight:      Height:        Intake/Output Summary (Last 24 hours) at 07/13/16 1007 Last data filed at 07/13/16 0911  Gross per 24 hour  Intake              750 ml  Output              400 ml  Net              350 ml   Filed Weights   07/11/16 2025  Weight: 50.2 kg (110 lb 11.2 oz)     Exam: General:  Thin elderly female, Hindi speaking. Calm today. Neck: supple,  no masses, trachea mildline  Cardiovascular: RRR, no murmurs or rubs, no peripheral edema  Respiratory: clear to auscultation bilaterally, no wheezes, no crackles  Abdomen: soft, nontender, nondistended Skin: dry, no rashes  Musculoskeletal: no joint effusions, normal range of motion  Neurologic: extraocular muscles intact, clear speech, moving all extremities with intact sensorium   Data Reviewed: CBC:  Recent Labs Lab 07/10/16 1856 07/11/16 0545 07/12/16 0542 07/13/16 0516  WBC 12.0* 11.1* 9.0 10.6*  HGB 10.0* 9.6* 9.5* 9.7*  HCT 31.5* 30.4* 29.6* 30.0*  MCV 66.3* 65.5* 65.2* 65.6*  PLT 320 317 311 [id number]   Basic Metabolic Panel:  Recent Labs Lab 07/10/16 1856 07/11/16 0545 07/12/16 0542 07/12/16 1134 07/13/16 0516  NA 138 138 140 136 137  K 3.9 3.5 2.6* 4.0 3.6  CL 98* 102 114* 103 105  CO2 31 28 18* 26 23  GLUCOSE 128* 109* 63* 155* 93  BUN 42* 32* 17 20 14   CREATININE 1.14* 0.88 0.83 0.76 0.77  CALCIUM 8.8* 8.0* 4.9* 7.8* 8.4*  MG  --   --   --  2.0  --    GFR: Estimated Creatinine Clearance: 32.9 mL/min (by C-G formula based on SCr of 0.77 mg/dL). Liver Function Tests:  Recent Labs Lab 07/10/16 1856 07/12/16 1134  AST 44* 49*  ALT 43 38  ALKPHOS 57 52  BILITOT 1.5* 1.1  PROT 6.8 6.3*  ALBUMIN 3.3* 3.0*    Recent Labs Lab 07/10/16 1856  LIPASE 32   No results for input(s): AMMONIA in the last 168 hours. Coagulation Profile:  Recent Labs Lab 07/11/16 0302  INR 1.04   Cardiac Enzymes: No results for input(s): CKTOTAL, CKMB, CKMBINDEX, TROPONINI in the last 168 hours. BNP (last 3 results) No results for input(s): PROBNP in the last 8760 hours. HbA1C: No results for input(s): HGBA1C in the last 72 hours. CBG:  Recent Labs Lab 07/11/16 0804 07/12/16 0757 07/13/16 0740  GLUCAP 100* 84 94   Lipid Profile: No results for input(s): CHOL, HDL, LDLCALC, TRIG, CHOLHDL, LDLDIRECT in the last 72 hours. Thyroid Function Tests: No results for  input(s): TSH, T4TOTAL, FREET4, T3FREE, THYROIDAB in the last 72 hours. Anemia Panel: No results for input(s): VITAMINB12, FOLATE, FERRITIN, TIBC, IRON, RETICCTPCT in the last 72 hours. Urine analysis:    Component Value Date/Time   COLORURINE AMBER (A) 07/10/2016 2304   APPEARANCEUR CLOUDY (A) 07/10/2016 2304   LABSPEC 1.022 07/10/2016 2304   PHURINE 6.0 07/10/2016 2304   GLUCOSEU NEGATIVE 07/10/2016 2304   HGBUR NEGATIVE 07/10/2016 2304   BILIRUBINUR NEGATIVE 07/10/2016 2304   KETONESUR NEGATIVE 07/10/2016 2304   PROTEINUR NEGATIVE 07/10/2016 2304   NITRITE NEGATIVE 07/10/2016 2304   LEUKOCYTESUR LARGE (A) 07/10/2016 2304   Sepsis Labs: @LABRCNTIP (procalcitonin:4,lacticidven:4)  ) Recent Results (from the past 240 hour(s))  Urine culture     Status: Abnormal   Collection Time: 07/10/16 11:04 PM  Result Value Ref Range Status   Specimen Description URINE, CLEAN CATCH  Final   Special Requests NONE  Final   Culture MULTIPLE SPECIES PRESENT, SUGGEST RECOLLECTION (A)  Final   Report Status 07/12/2016 FINAL  Final  Gastrointestinal Panel by PCR , Stool     Status: Abnormal   Collection Time: 07/11/16  2:02 AM  Result Value Ref Range Status   Campylobacter species NOT DETECTED NOT DETECTED Final   Plesimonas shigelloides NOT DETECTED NOT DETECTED Final   Salmonella species NOT DETECTED NOT DETECTED Final   Yersinia enterocolitica NOT DETECTED NOT DETECTED Final   Vibrio species NOT DETECTED NOT DETECTED Final   Vibrio cholerae NOT DETECTED NOT DETECTED Final   Enteroaggregative E coli (EAEC) DETECTED (A) NOT DETECTED Final    Comment: CRITICAL RESULT CALLED TO, READ BACK BY AND VERIFIED WITH: BILLY HOOKER 07/11/16 1250 SGD CRITICAL RESULT CALLED TO, READ BACK BY AND VERIFIED WITH: BROWN,E AT 1330 ON [id number] BY HOOKER,B    Enteropathogenic E coli (EPEC) NOT DETECTED NOT DETECTED Final   Enterotoxigenic E coli (ETEC) NOT DETECTED NOT DETECTED Final   Shiga like toxin producing  E coli (STEC) NOT DETECTED NOT DETECTED Final   Shigella/Enteroinvasive E coli (EIEC) NOT DETECTED NOT DETECTED Final   Cryptosporidium NOT DETECTED NOT DETECTED Final   Cyclospora cayetanensis NOT DETECTED NOT DETECTED Final   Entamoeba histolytica NOT DETECTED NOT DETECTED Final   Giardia lamblia NOT DETECTED NOT DETECTED Final   Adenovirus F40/41 NOT DETECTED NOT DETECTED Final   Astrovirus NOT DETECTED NOT DETECTED Final   Norovirus GI/GII NOT DETECTED NOT DETECTED Final   Rotavirus A NOT DETECTED NOT DETECTED Final   Sapovirus (I, II, IV, and V) NOT DETECTED NOT DETECTED Final  Culture, blood (x 2)     Status: None (Preliminary result)   Collection Time: 07/11/16  3:02 AM  Result Value Ref Range Status   Specimen Description BLOOD RIGHT ARM  Final   Special Requests IN PEDIATRIC BOTTLE 0.5CC  Final   Culture   Final    NO GROWTH 1 DAY Performed at [facility]    Report Status PENDING  Incomplete  Culture, blood (x 2)     Status: None (Preliminary result)   Collection Time: 07/11/16  3:02 AM  Result Value Ref Range Status   Specimen Description BLOOD RIGHT HAND  Final   Special Requests IN PEDIATRIC BOTTLE 2CC  Final   Culture   Final    NO GROWTH 1 DAY Performed at [facility]    Report Status PENDING  Incomplete     Studies: No results found.  Scheduled Meds: . atenolol  75 mg Oral Daily  . ciprofloxacin  400 mg Intravenous Q12H  . enoxaparin (LOVENOX) injection  30 mg Subcutaneous Q24H  . levothyroxine  125 mcg Oral QAC breakfast  . multivitamin  1 tablet Oral Daily  . polyethylene glycol  17 g Oral BID  . senna-docusate  1 tablet Oral BID  . tamoxifen  20 mg Oral Daily    Continuous Infusions:    LOS: 2 days   Time spent: 23 minutes  Shakai Dolley Marry Guan, MD [facility] Pager [phone]  If 7PM-7AM, please contact night-coverage [email or web address] Password [email or web address] 07/13/2016, 10:07 AM

## 2016-07-13 NOTE — Care Management Obs Status (Signed)
[place] NOTIFICATION   Patient Details  Name: Carrie Cain MRN: [id number] Date of Birth: [birth year]   Medicare Observation Status Notification Given:  Yes/ spoke with daughter over the phone verbal understanding and signature given.    Leeroy Cha, RN 07/13/2016, 4:07 PM

## 2016-07-14 DIAGNOSIS — E860 Dehydration: Secondary | ICD-10-CM | POA: Diagnosis not present

## 2016-07-14 DIAGNOSIS — N390 Urinary tract infection, site not specified: Secondary | ICD-10-CM | POA: Diagnosis not present

## 2016-07-14 DIAGNOSIS — N3000 Acute cystitis without hematuria: Secondary | ICD-10-CM | POA: Diagnosis not present

## 2016-07-14 LAB — BASIC METABOLIC PANEL
ANION GAP: 7 (ref 5–15)
BUN: 10 mg/dL (ref 6–20)
CALCIUM: 8.2 mg/dL — AB (ref 8.9–10.3)
CO2: 23 mmol/L (ref 22–32)
Chloride: 106 mmol/L (ref 101–111)
Creatinine, Ser: 0.71 mg/dL (ref 0.44–1.00)
GLUCOSE: 89 mg/dL (ref 65–99)
Potassium: 3.6 mmol/L (ref 3.5–5.1)
Sodium: 136 mmol/L (ref 135–145)

## 2016-07-14 LAB — CBC
HCT: 31.8 % — ABNORMAL LOW (ref 36.0–46.0)
HEMOGLOBIN: 10.4 g/dL — AB (ref 12.0–15.0)
MCH: 21.4 pg — ABNORMAL LOW (ref 26.0–34.0)
MCHC: 32.7 g/dL (ref 30.0–36.0)
MCV: 65.4 fL — ABNORMAL LOW (ref 78.0–100.0)
Platelets: 305 10*3/uL (ref 150–400)
RBC: 4.86 MIL/uL (ref 3.87–5.11)
RDW: 15.7 % — AB (ref 11.5–15.5)
WBC: 11.3 10*3/uL — ABNORMAL HIGH (ref 4.0–10.5)

## 2016-07-14 LAB — GLUCOSE, CAPILLARY: GLUCOSE-CAPILLARY: 79 mg/dL (ref 65–99)

## 2016-07-14 MED ORDER — POLYETHYLENE GLYCOL 3350 17 G PO PACK: 17.0000 g | PACK | Freq: Two times a day (BID) | ORAL | 0 refills | Status: AC | PRN

## 2016-07-14 MED ORDER — SENNOSIDES-DOCUSATE SODIUM 8.6-50 MG PO TABS: 1.0000 | ORAL_TABLET | Freq: Two times a day (BID) | ORAL | 0 refills | Status: DC

## 2016-07-14 MED ORDER — CIPROFLOXACIN HCL 500 MG PO TABS: 500.0000 mg | ORAL_TABLET | Freq: Two times a day (BID) | ORAL | 0 refills | Status: DC

## 2016-07-14 MED ORDER — CIPROFLOXACIN HCL 500 MG PO TABS
500.0000 mg | ORAL_TABLET | Freq: Two times a day (BID) | ORAL | Status: DC
  Filled 2016-07-14: qty 1

## 2016-07-14 MED ORDER — POLYETHYLENE GLYCOL 3350 17 G PO PACK: 17.0000 g | PACK | Freq: Two times a day (BID) | ORAL | 0 refills | Status: DC | PRN

## 2016-07-14 MED ORDER — TRAMADOL HCL 50 MG PO TABS: 50.0000 mg | ORAL_TABLET | Freq: Four times a day (QID) | ORAL | 0 refills | Status: AC | PRN

## 2016-07-14 MED ORDER — SENNOSIDES-DOCUSATE SODIUM 8.6-50 MG PO TABS: 1.0000 | ORAL_TABLET | Freq: Two times a day (BID) | ORAL | 0 refills | Status: AC

## 2016-07-14 MED ORDER — CIPROFLOXACIN HCL 500 MG PO TABS: 500.0000 mg | ORAL_TABLET | Freq: Two times a day (BID) | ORAL | 0 refills | Status: AC

## 2016-07-14 NOTE — Progress Notes (Signed)
SLP Cancellation Note  Patient Details Name: Jully Sinkfield MRN: [id number] DOB: 12-18-1922   Cancelled treatment:       Reason Eval/Treat Not Completed:  (pt to dc home today per notes; no family present at this time)   Claudie Fisherman, [place] [facility] SLP [phone]

## 2016-07-14 NOTE — Progress Notes (Signed)
Patient  discharged home with [place].

## 2016-07-14 NOTE — Progress Notes (Signed)
  Date:  July 10, 2016 Chart reviewed for concurrent status and case management needs. Will continue to follow the patient for changes and needs: Discharge Planning: advanced hhc for ,PT,OT and aide patient also has full time caregiver at home. Velva Harman, BSN, [place], [place]

## 2016-07-14 NOTE — Discharge Summary (Signed)
Discharge Summary  Carrie Cain [id number] DOB: May 27, 1923  PCP: Lilian Coma, MD  Admit date: 07/10/2016 Discharge date: 07/14/2016   Recommendations for Outpatient Follow-up:  1. PCP 2 weeks  2. [facility] to provide PT/OT/SLP/RN/Aide.  Discharge Diagnoses:  Active Hospital Problems   Diagnosis Date Noted  . UTI (urinary tract infection) 07/11/2016  . Dehydration 07/11/2016  . Hypothyroidism 07/11/2016  . Depression 07/11/2016  . AKI (acute kidney injury) ([place]) 07/11/2016  . Diarrhea 07/11/2016  . Abdominal pain 07/11/2016  . UTI (lower urinary tract infection) 07/11/2016  . Breast cancer, right breast ([place]) 09/10/2014  . Chronic diastolic CHF (congestive heart failure) ([place]) 08/21/2014  . HTN (hypertension) 08/21/2014    Resolved Hospital Problems   Diagnosis Date Noted Date Resolved  No resolved problems to display.    Discharge Condition: Stable   Diet recommendation: Thick clear liquids, advance per SLP recommendations.   Vitals:   07/13/16 1459 07/14/16 0617  BP: (!) 140/58 (!) 134/98  Pulse: 61 95  Resp: 20 20  Temp: 98.9 F (37.2 C) 98 F (36.7 C)    History of present illness:  82 y.o.femalewith medical history significant of hypertension, hyperlipidemia, hypothyroidism, depression, hearing loss, known breast cancer on tamoxifen (no surgery, radiation or chemotherapy), anemia, macular degeneration, diastolic congestive heart failure, who presents with diarrhea, increased urinary frequency, fall, chest pain under right breast. She was found to have stool impaction, possible UTI and Ecoli in her stool for which she is being treated. Urine culture negative, antibiotics changed to treat enteroaggragative Ecoli from stool PCR. She was on IV cipro for this, changed to PO at discharge. Note no colitis on CT of the abdomen. She has been stable, less agitated after impaction was relieved by enema. She has been stable and after several  discussion with daughter in law Santiago Glad, she plans to take her home today. Plan to maximize [facility] services and also increase private caregiver services. I discussed plan for discharge with Santiago Glad via phone this AM in the presence of care management and their long time caregiver. They are all in agreement with plan for home today with PO antibiotics and maximized [facility] services.  Assessment/Plan: UTI: pt has increased urinary frequency and positive urinalysis, consistent with UTI, but UCx contaminated. - initially on IV Rocephin has been d/c as urine culture negative - UTI ruled out  Diarrhea and abdominal pain:Patient's daughter-in-law states that patient has diarrhea for 3 days, 5 bowel movementswith loose stool. Not on laxatives. Interestingly, CT abdomen/pelvis showed stool distended rectum concerning for fecal impaction. Moderate amount of retained large bowel stool without bowel obstruction. - GI panel PCR positive for EAEcoli - changed abx to Cipro/Flagyl 9/25 and will continue IV Cipro only for now total 10 days. -When necessary Zofran for nausea, morphine and oxycodone for pain - PO Miralax without effect as she refused PO, given soap suds enema 9/24 and abd soft today  HTN: -hold lasix due to AKI, can resume if evidence of fluid overload -Continue atenolol  Chronic diastolic [id number] echo on [id number] showed EF 65-70%. Patient does not have leg edema or JVD. CHF is compensated. -Held Lasix due to acute renal injury which is resolved now, continue to hold due to low PO intake -Continue atenolol  Breast cancer, right breast (HCC):Patient is on tamoxifen currently. Followed up by Dr. Jana Hakim. CT-abdomen/pelvis showed possible metastasized disease. -Continue tamoxifen -she will follow-up with Dr. Jana Hakim, discussed imaging findings at length with Santiago Glad, recommend further imaging including pelvic US and  maybe PET-CT per Dr. Jana Hakim  Hypothyroidism: Last TSH was not on record -Continue  home Synthroid  Depression: -Continue home medications: Seroquel, Zoloft  AKI (acute kidney injury) (HCC):on admission Creatinine 1.14, BUN 42, likely due to dehydration. -renal function now back to normal  Fall and R flank pain: May be due to compression fracture of L3-L4. -pain control as above  Hypokalemia - likely due to loose stool - repleted and normal, follow closely  DVT ppx: SCD  Code Status:Full code  Family Communication: Daughter in Sports coach on the phone this AM.  Disposition Plan: Home today with [facility] services. Case management assistance appreciated.  Consults called:none Discharge Exam: BP (!) 134/98 (BP Location: Right Arm)   Pulse 95   Temp 98 F (36.7 C) (Oral)   Resp 20   Ht 4\' 10"  (1.473 m)   Wt 50.2 kg (110 lb 11.2 oz)   SpO2 96%   BMI 23.14 kg/m  General:  Alert, calm, in no acute distress, thin, cachectic  Neck: supple, no masses, trachea mildline  Cardiovascular: RRR, no murmurs or rubs, no peripheral edema  Respiratory: clear to auscultation bilaterally, no wheezes, no crackles  Abdomen: soft, nontender, nondistended, normal bowel tones heard  Skin: dry, no rashes  Musculoskeletal: no joint effusions, normal range of motion  Psychiatric: appropriate affect Neurologic: extraocular muscles intact, clear speech, moving all extremities with intact sensorium   Discharge Instructions You were cared for by a hospitalist during your hospital stay. If you have any questions about your discharge medications or the care you received while you were in the hospital after you are discharged, you can call the unit and asked to speak with the hospitalist on call if the hospitalist that took care of you is not available. Once you are discharged, your primary care physician will handle any further medical issues. Please note that NO REFILLS for any discharge medications will be authorized once you are discharged, as it is imperative that you return to your  primary care physician (or establish a relationship with a primary care physician if you do not have one) for your aftercare needs so that they can reassess your need for medications and monitor your lab values.  Discharge Instructions    Call MD for:  persistant nausea and vomiting    Complete by:  As directed    Call MD for:  severe uncontrolled pain    Complete by:  As directed    Call MD for:  temperature >100.4    Complete by:  As directed    Diet - low sodium heart healthy    Complete by:  As directed    Increase activity slowly    Complete by:  As directed        Medication List    STOP taking these medications   furosemide 20 MG tablet Commonly known as:  LASIX   QUEtiapine 25 MG tablet Commonly known as:  SEROQUEL   sertraline 25 MG tablet Commonly known as:  ZOLOFT   UNABLE TO FIND     TAKE these medications   acetaminophen-codeine 300-30 MG tablet Commonly known as:  TYLENOL #3 Take 1 tablet by mouth every 6 (six) hours as needed for moderate pain or severe pain.   ALEVE PO Take 125 mg by mouth 3 (three) times daily.   atenolol 50 MG tablet Commonly known as:  TENORMIN Take 75 mg by mouth daily. 1 1/2 tablets daily   ciprofloxacin 500 MG tablet Commonly known as:  CIPRO Take 1 tablet (500 mg total) by mouth 2 (two) times daily.   diclofenac sodium 1 % Gel Commonly known as:  VOLTAREN Apply 4 g topically 4 (four) times daily as needed (pain).   levothyroxine 125 MCG tablet Commonly known as:  SYNTHROID, LEVOTHROID Take 125 mcg by mouth daily before breakfast.   OCUVITE ADULT 50+ Caps Take 1 capsule by mouth daily.   ONE-A-DAY WOMENS 50 PLUS PO Take 1 tablet by mouth daily.   omeprazole 20 MG capsule Commonly known as:  PRILOSEC Take 1 capsule (20 mg total) by mouth at bedtime.   polyethylene glycol packet Commonly known as:  MIRALAX / GLYCOLAX Take 17 g by mouth 2 (two) times daily as needed for moderate constipation.   polyvinyl alcohol  1.4 % ophthalmic solution Commonly known as:  LIQUIFILM TEARS 1 drop as needed for dry eyes (prn post avastin pain).   senna-docusate 8.6-50 MG tablet Commonly known as:  Senokot-S Take 1 tablet by mouth 2 (two) times daily.   tamoxifen 20 MG tablet Commonly known as:  NOLVADEX Take 1 tablet (20 mg total) by mouth daily.   traMADol 50 MG tablet Commonly known as:  ULTRAM Take 1 tablet (50 mg total) by mouth every 6 (six) hours as needed. What changed:  reasons to take this      Allergies  Allergen Reactions  . Sulfa Antibiotics Rash  . Remeron [Mirtazapine] Other (See Comments)    Increased dementia issues, symptoms, agitated, combative      The results of significant diagnostics from this hospitalization (including imaging, microbiology, ancillary and laboratory) are listed below for reference.    Significant Diagnostic Studies: Dg Chest 2 View  Result Date: 07/10/2016 CLINICAL DATA:  Pain following fall 1 week prior. Decreased appetite. History of breast carcinoma. Hypertension. EXAM: CHEST  2 VIEW COMPARISON:  August 21, 2014 FINDINGS: The degree of inspiration is shallow. There is mild bibasilar atelectasis. There is no edema or consolidation. Heart is borderline enlarged with pulmonary vascularity within normal limits. There is a large hiatal type hernia. There is atherosclerotic calcification aorta. Bones are diffusely osteoporotic. There is marked anterior wedging of a lower thoracic vertebral body. There is also anterior wedging of a mid thoracic vertebral body which has increased in severity since prior study. There is increase in kyphosis. No adenopathy. IMPRESSION: No edema or consolidation. Stable cardiac silhouette. Aortic atherosclerosis. Sizable hiatal type hernia. Compression fractures in the mid and lower thoracic spine with increase in kyphosis. Bones are diffusely osteoporotic. Electronically Signed   By: Lowella Grip III M.D.   On: 07/10/2016 20:41   Ct  Head Wo Contrast  Result Date: 07/11/2016 CLINICAL DATA:  Fall 1 week ago.  Hypertension.  Dementia. EXAM: CT HEAD WITHOUT CONTRAST TECHNIQUE: Contiguous axial images were obtained from the base of the skull through the vertex without intravenous contrast. COMPARISON:  None. FINDINGS: Brain: Motion degradation, despite 2 attempts. Expected cerebral volume loss for age. Mild low density in the periventricular white matter likely related to small vessel disease. Physiologic calcifications in the bilateral basal ganglia. No mass lesion, hemorrhage, hydrocephalus, acute infarct, intra-axial, or extra-axial fluid collection. Vascular: No hyperdense vessel or unexpected calcification. Skull: No significant soft tissue swelling.  No skull fracture. Sinuses/Orbits: Surgical changes involving both globes. Normal paranasal sinuses. Fluid in both mastoid air cells. No underlying fracture. Cerumen in both external ear canals. Other: None IMPRESSION: 1. Motion degraded exam. 2. No acute or posttraumatic deformity identified. 3. Bilateral mastoid effusions.  Electronically Signed   By: Abigail Miyamoto M.D.   On: 07/11/2016 10:27   Ct Abdomen Pelvis W Contrast  Result Date: 07/11/2016 CLINICAL DATA:  RIGHT flank pain, decreased appetite after fall 1 week ago. History of breast cancer, hypertension, hyperlipidemia. EXAM: CT ABDOMEN AND PELVIS WITH CONTRAST TECHNIQUE: Multidetector CT imaging of the abdomen and pelvis was performed using the standard protocol following bolus administration of intravenous contrast. CONTRAST:  41mL ISOVUE-300 IOPAMIDOL (ISOVUE-300) INJECTION 61% COMPARISON:  None. FINDINGS: LOWER CHEST: Coarsened pulmonary interstitium without pleural effusion or focal consolidation. Heart size is mildly enlarged. No pericardial effusion. Mild coronary artery calcifications. HEPATOBILIARY: Liver is diffusely hypodense compatible with steatosis. Enlarged intrahepatic inferior vena cava with reflux contrast consistent  with RIGHT heart failure. Gallbladder is unremarkable. PANCREAS: Normal. SPLEEN: Multiple irregular clustered cysts measure up to 12 x 18 mm. ADRENALS/URINARY TRACT: Kidneys are orthotopic, demonstrating symmetric enhancement. No nephrolithiasis, hydronephrosis or solid renal masses. The unopacified ureters are normal in course and caliber. Delayed imaging through the kidneys demonstrates symmetric prompt contrast excretion within the proximal urinary collecting system. Urinary bladder is partially distended and unremarkable. Normal adrenal glands. STOMACH/BOWEL: Stool distended rectum to 7.5 cm. Mild colonic diverticulosis. Large diaphragmatic hernia containing the majority of the stomach and, a loop of colon. Moderate amount of retained large bowel stool. VASCULAR/LYMPHATIC: Aortoiliac vessels are normal in course and caliber, severe calcific atherosclerosis. No lymphadenopathy by CT size criteria. REPRODUCTIVE: 2.4 x 3.4 cm homogeneously hypodense RIGHT adnexal cyst. Fluid distended endometrium to 16 mm. OTHER: No intraperitoneal free fluid or free air. MUSCULOSKELETAL: 3.4 x 4 cm solid RIGHT breast mass, adjacent 1.7 cm mass, partially imaged 4.1 x 2.8 cm solid mass extending toward the RIGHT axilla. Osteopenia. Severe Old T12 compression fracture. Mild age indeterminate L3, moderate L4 compression fractures. IMPRESSION: Stool distended rectum concerning for fecal impaction. Moderate amount of retained large bowel stool without bowel obstruction. Multiple thoracolumbar compression fractures. Given history of fall 1 week ago, the L3 and L4 compression fractures could be acute Multiple solid RIGHT breast masses consistent with breast cancer. Mild cardiomegaly and findings of RIGHT heart failure. **An incidental finding of potential clinical significance has been found. 3.4 cm benign-appearing RIGHT adnexal cyst; recommend follow-up pelvic ultrasound on a nonemergent basis** This recommendation follows ACR  consensus guidelines: White Paper of the ACR Incidental Findings Committee II on Adnexal Findings. J Am Coll Radiol [phone]. **An incidental finding of potential clinical significance has been found. Fluid distended endometrium, recommend close attention on recommended pelvic ultrasound.** **An incidental finding of potential clinical significance has been found. Multiple irregular splenic cystic masses. Though these could represent lymphangiomas, history of cancer metastasis is a concern and would be better characterized on MRI or PET-CT as clinically indicated.** Acute findings discussed with and reconfirmed by Dr.NATHAN PICKERING on 07/10/2016 at 12:06 am. Electronically Signed   By: Elon Alas M.D.   On: 07/11/2016 00:12    Microbiology: Recent Results (from the past 240 hour(s))  Urine culture     Status: Abnormal   Collection Time: 07/10/16 11:04 PM  Result Value Ref Range Status   Specimen Description URINE, CLEAN CATCH  Final   Special Requests NONE  Final   Culture MULTIPLE SPECIES PRESENT, SUGGEST RECOLLECTION (A)  Final   Report Status 07/12/2016 FINAL  Final  Gastrointestinal Panel by PCR , Stool     Status: Abnormal   Collection Time: 07/11/16  2:02 AM  Result Value Ref Range Status   Campylobacter species NOT DETECTED  NOT DETECTED Final   Plesimonas shigelloides NOT DETECTED NOT DETECTED Final   Salmonella species NOT DETECTED NOT DETECTED Final   Yersinia enterocolitica NOT DETECTED NOT DETECTED Final   Vibrio species NOT DETECTED NOT DETECTED Final   Vibrio cholerae NOT DETECTED NOT DETECTED Final   Enteroaggregative E coli (EAEC) DETECTED (A) NOT DETECTED Final    Comment: CRITICAL RESULT CALLED TO, READ BACK BY AND VERIFIED WITH: BILLY HOOKER 07/11/16 1250 SGD CRITICAL RESULT CALLED TO, READ BACK BY AND VERIFIED WITH: BROWN,E AT 1330 ON [id number] BY HOOKER,B    Enteropathogenic E coli (EPEC) NOT DETECTED NOT DETECTED Final   Enterotoxigenic E coli (ETEC) NOT  DETECTED NOT DETECTED Final   Shiga like toxin producing E coli (STEC) NOT DETECTED NOT DETECTED Final   Shigella/Enteroinvasive E coli (EIEC) NOT DETECTED NOT DETECTED Final   Cryptosporidium NOT DETECTED NOT DETECTED Final   Cyclospora cayetanensis NOT DETECTED NOT DETECTED Final   Entamoeba histolytica NOT DETECTED NOT DETECTED Final   Giardia lamblia NOT DETECTED NOT DETECTED Final   Adenovirus F40/41 NOT DETECTED NOT DETECTED Final   Astrovirus NOT DETECTED NOT DETECTED Final   Norovirus GI/GII NOT DETECTED NOT DETECTED Final   Rotavirus A NOT DETECTED NOT DETECTED Final   Sapovirus (I, II, IV, and V) NOT DETECTED NOT DETECTED Final  Culture, blood (x 2)     Status: None (Preliminary result)   Collection Time: 07/11/16  3:02 AM  Result Value Ref Range Status   Specimen Description BLOOD RIGHT ARM  Final   Special Requests IN PEDIATRIC BOTTLE 0.5CC  Final   Culture   Final    NO GROWTH 2 DAYS Performed at [facility]    Report Status PENDING  Incomplete  Culture, blood (x 2)     Status: None (Preliminary result)   Collection Time: 07/11/16  3:02 AM  Result Value Ref Range Status   Specimen Description BLOOD RIGHT HAND  Final   Special Requests IN PEDIATRIC BOTTLE 2CC  Final   Culture   Final    NO GROWTH 2 DAYS Performed at [facility]    Report Status PENDING  Incomplete     Labs: Basic Metabolic Panel:  Recent Labs Lab 07/11/16 0545 07/12/16 0542 07/12/16 1134 07/13/16 0516 07/14/16 0546  NA 138 140 136 137 136  K 3.5 2.6* 4.0 3.6 3.6  CL 102 114* 103 105 106  CO2 28 18* 26 23 23   GLUCOSE 109* 63* 155* 93 89  BUN 32* 17 20 14 10   CREATININE 0.88 0.83 0.76 0.77 0.71  CALCIUM 8.0* 4.9* 7.8* 8.4* 8.2*  MG  --   --  2.0  --   --    Liver Function Tests:  Recent Labs Lab 07/10/16 1856 07/12/16 1134  AST 44* 49*  ALT 43 38  ALKPHOS 57 52  BILITOT 1.5* 1.1  PROT 6.8 6.3*  ALBUMIN 3.3* 3.0*    Recent Labs Lab 07/10/16 1856  LIPASE  32   No results for input(s): AMMONIA in the last 168 hours. CBC:  Recent Labs Lab 07/10/16 1856 07/11/16 0545 07/12/16 0542 07/13/16 0516 07/14/16 0546  WBC 12.0* 11.1* 9.0 10.6* 11.3*  HGB 10.0* 9.6* 9.5* 9.7* 10.4*  HCT 31.5* 30.4* 29.6* 30.0* 31.8*  MCV 66.3* 65.5* 65.2* 65.6* 65.4*  PLT 320 317 311 313 305   Cardiac Enzymes: No results for input(s): CKTOTAL, CKMB, CKMBINDEX, TROPONINI in the last 168 hours. BNP: BNP (last 3 results)  Recent Labs  07/11/16 0302  BNP 317.0*    ProBNP (last 3 results) No results for input(s): PROBNP in the last 8760 hours.  CBG:  Recent Labs Lab 07/11/16 0804 07/12/16 0757 07/13/16 0740 07/14/16 0756  GLUCAP 100* 84 94 79    Time spent: 42 minutes were spent in preparing this discharge including medication reconciliation, counseling, and coordination of care.  Signed:  Henleigh Robello [employer]  [facility] 07/14/2016, 10:56 AM

## 2016-07-14 NOTE — Progress Notes (Signed)
Pt discharged from the unit via wheelchair. Discharge instructions were reviewed with the pt daughters. [facility] was set [facility] for pt. No questions or concerns at this time.  Keviana Guida W Jamile Rekowski, RN

## 2016-07-16 LAB — CULTURE, BLOOD (ROUTINE X 2)
Culture: NO GROWTH
Culture: NO GROWTH

## 2016-07-16 NOTE — Progress Notes (Signed)
   07/13/16 1002  PT Time Calculation  PT Start Time (ACUTE ONLY) 0950  PT Stop Time (ACUTE ONLY) 0958  PT Time Calculation (min) (ACUTE ONLY) 8 min  PT G-Codes **NOT FOR INPATIENT CLASS**  Functional Assessment Tool Used clinical judgement  Functional Limitation Mobility: Walking and moving around  Mobility: Walking and Moving Around Current Status [id number]) CJ  Mobility: Walking and Moving Around Goal Status [id number]) CI  PT General Charges  $$ ACUTE PT VISIT 1 Procedure  PT Evaluation  $PT Eval Low Complexity 1 Procedure   Weston Anna, MPT [phone]

## 2016-07-20 ENCOUNTER — Telehealth: Payer: Self-pay | Admitting: *Deleted

## 2016-07-20 NOTE — Telephone Encounter (Signed)
FYI Call from patient's "caregiver Vernise reporting patient and family have met with Hospice.  Hospice has said she has three weeks to three days to live and no need to keep appointments at the [facility].  Could you cancel the appointment on July 22, 2016."   Scheduling message sent.

## 2016-07-22 ENCOUNTER — Ambulatory Visit: Payer: Self-pay | Admitting: Oncology

## 2016-07-30 ENCOUNTER — Other Ambulatory Visit: Payer: Self-pay | Admitting: Oncology

## 2016-09-23 NOTE — Progress Notes (Signed)
   07/11/16 1524  SLP G-Codes **NOT FOR INPATIENT CLASS**  Functional Assessment Tool Used (clinical judgement)  Functional Limitations Swallowing  Swallow Current Status [id number]) CL  Swallow Goal Status [id number]) CL  Swallow Discharge Status [id number]) CL  SLP Evaluations  $ SLP Speech Visit 1 Procedure  SLP Evaluations  $BSS Swallow 1 Procedure  Luanna Salk, MS [facility] SLP [phone] Completed for Clovis Cao SLP

## 2017-11-12 IMAGING — CT CT HEAD W/O CM
2 of 4 series · 16 of 30 positions shown, 18 images · non-contrast
Comparison: None.

CLINICAL DATA: Fall 1 week ago.  Hypertension.  Dementia.

EXAM:
CT HEAD WITHOUT CONTRAST
TECHNIQUE: Contiguous axial images were obtained from the base of the skull
through the vertex without intravenous contrast.

[Series 602: <mpr thick range> · axial · 0.42mm/px · z∈[-141,-24]mm · 8 of 77 slices shown, 10 images]
[im 9/77  brain]
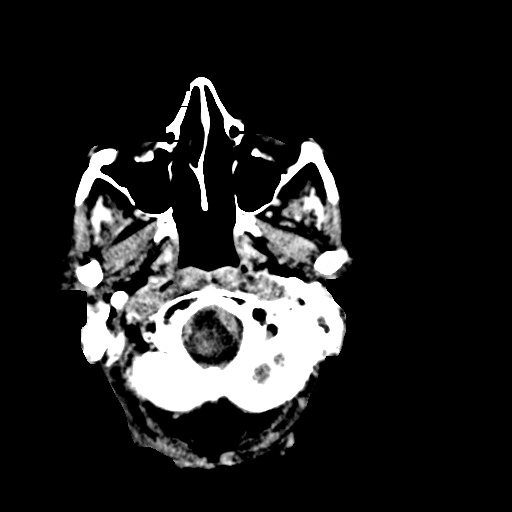
[im 9/77  bone]
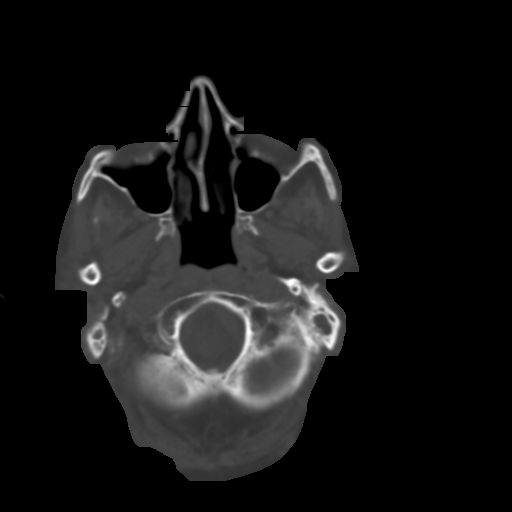
[im 17/77  brain]
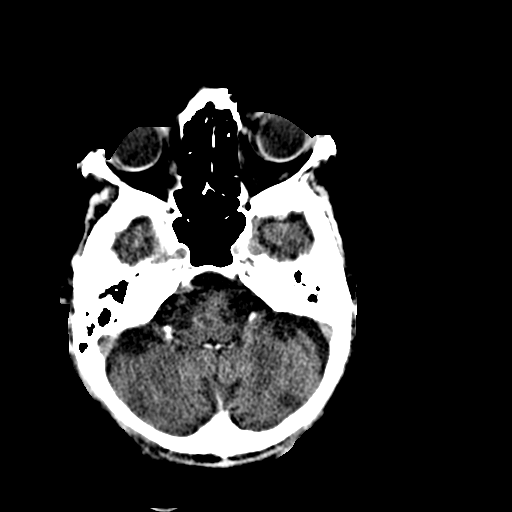
[im 26/77  brain]
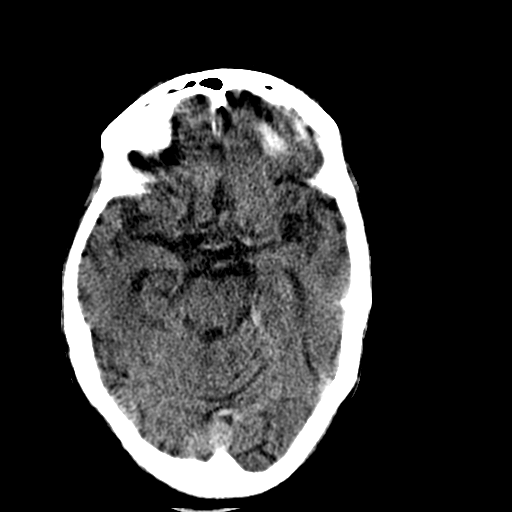
[im 34/77  brain]
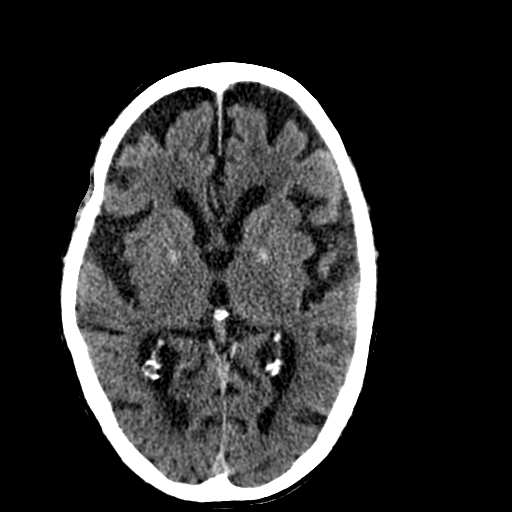
[im 43/77  brain]
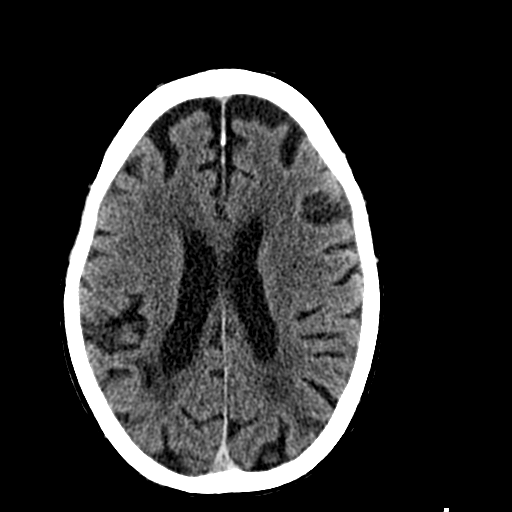
[im 43/77  bone]
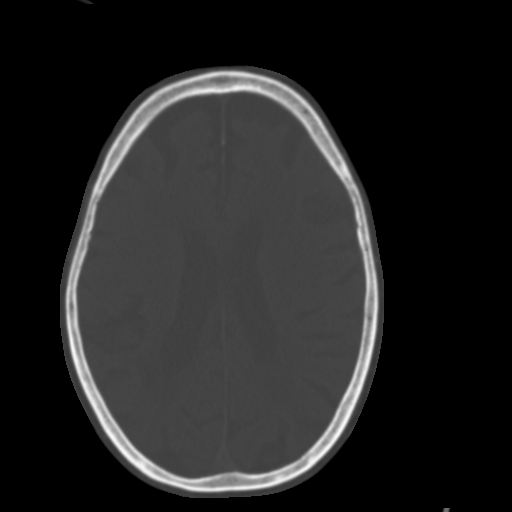
[im 51/77  brain]
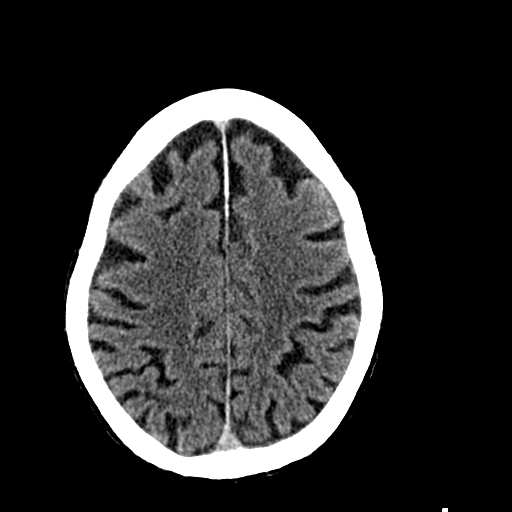
[im 60/77  brain]
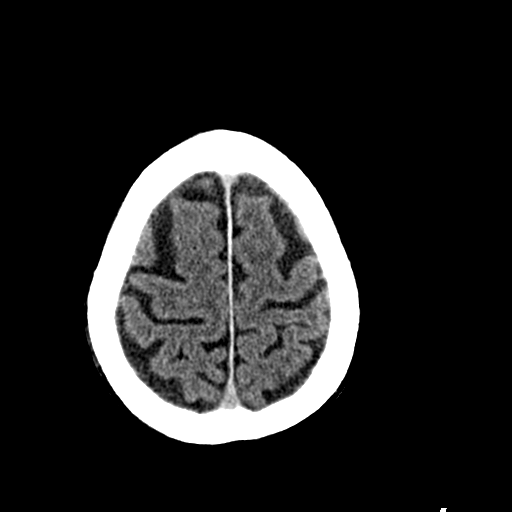
[im 68/77  brain]
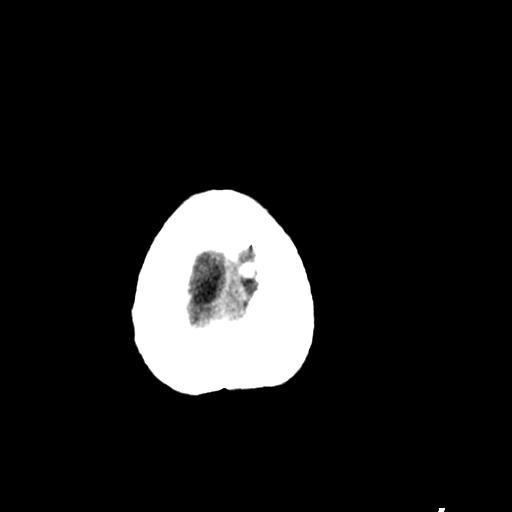

[Series 603: <mpr thick range(1)> · axial · 0.42mm/px · z∈[-173,-36]mm · 8 of 86 slices shown]
[im 9/86  brain]
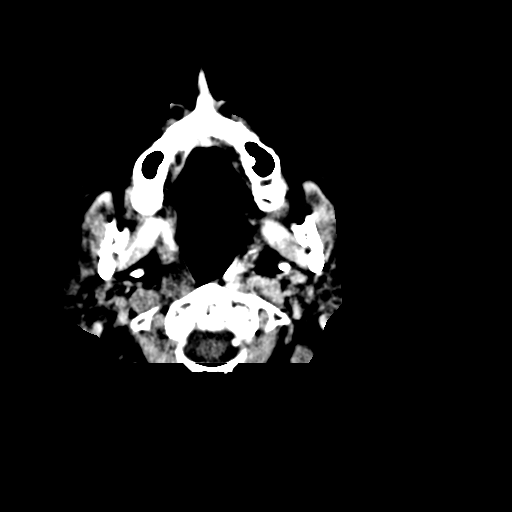
[im 18/86  brain]
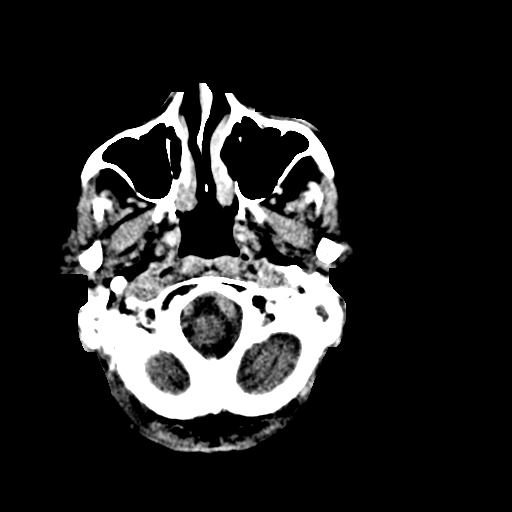
[im 26/86  brain]
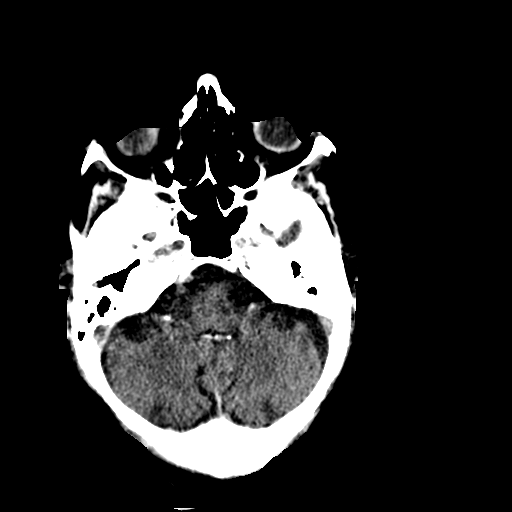
[im 35/86  brain]
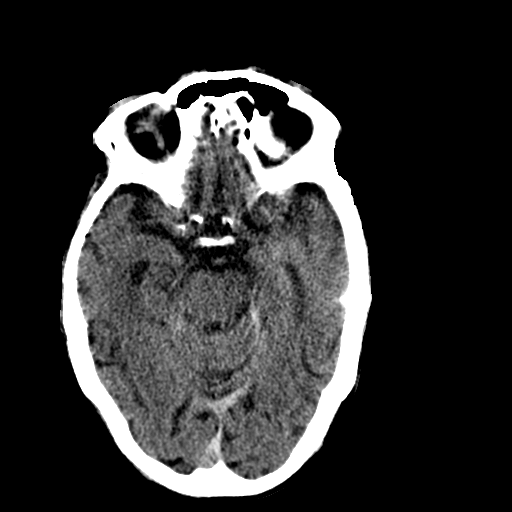
[im 52/86  brain]
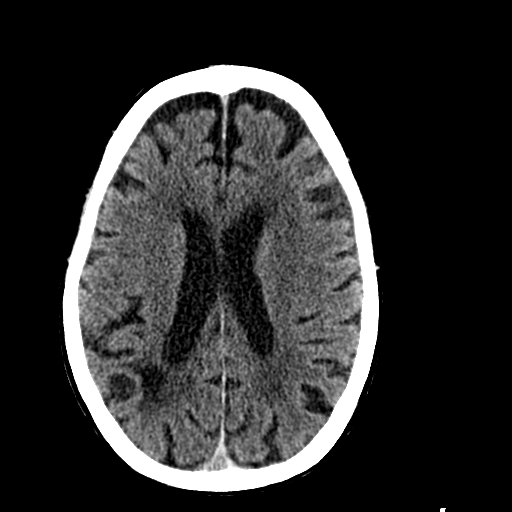
[im 60/86  brain]
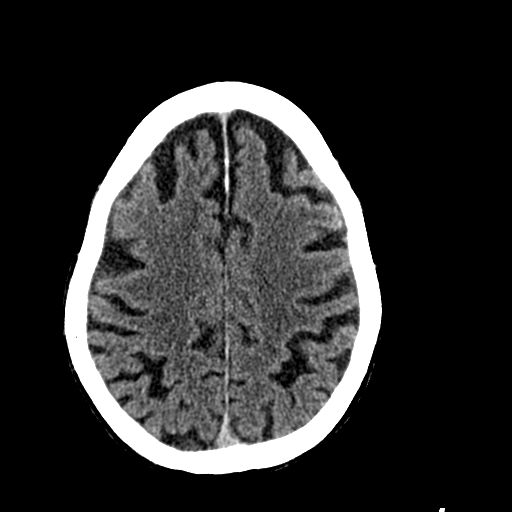
[im 69/86  brain]
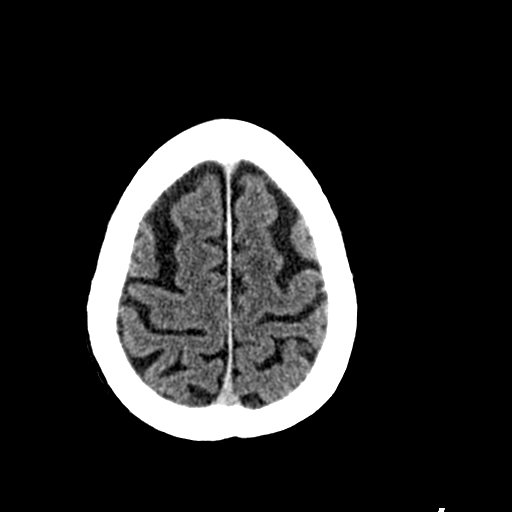
[im 77/86  brain]
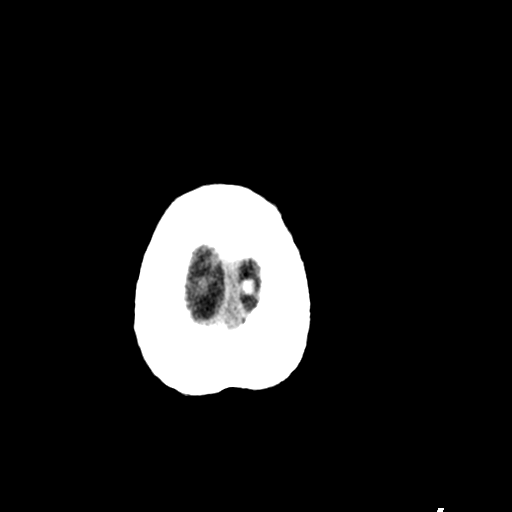

[16 of 30 positions shown; findings below may reference images not displayed]

FINDINGS: Brain: Motion degradation, despite 2 attempts. Expected cerebral
volume loss for age. Mild low density in the periventricular white
matter likely related to small vessel disease. Physiologic
calcifications in the bilateral basal ganglia. No mass lesion,
hemorrhage, hydrocephalus, acute infarct, intra-axial, or
extra-axial fluid collection.

Vascular: No hyperdense vessel or unexpected calcification.

Skull: No significant soft tissue swelling.  No skull fracture.

Sinuses/Orbits: Surgical changes involving both globes. Normal
paranasal sinuses. Fluid in both mastoid air cells. No underlying
fracture. Cerumen in both external ear canals.

Other: None
IMPRESSION: 1. Motion degraded exam.
2. No acute or posttraumatic deformity identified.
3. Bilateral mastoid effusions.
